# Patient Record
Sex: Female | Born: 1970 | Race: White | Hispanic: No | Marital: Married | Smoking: Never smoker
Health system: Southern US, Community
[De-identification: ages and names within clinical notes are randomized; demographics above are authoritative.]

## PROBLEM LIST (undated history)

## (undated) DIAGNOSIS — L719 Rosacea, unspecified: Secondary | ICD-10-CM

## (undated) DIAGNOSIS — Z8659 Personal history of other mental and behavioral disorders: Secondary | ICD-10-CM

## (undated) DIAGNOSIS — J302 Other seasonal allergic rhinitis: Secondary | ICD-10-CM

## (undated) DIAGNOSIS — Z87898 Personal history of other specified conditions: Secondary | ICD-10-CM

## (undated) DIAGNOSIS — R51 Headache: Secondary | ICD-10-CM

## (undated) DIAGNOSIS — D649 Anemia, unspecified: Secondary | ICD-10-CM

## (undated) DIAGNOSIS — R52 Pain, unspecified: Secondary | ICD-10-CM

## (undated) DIAGNOSIS — G709 Myoneural disorder, unspecified: Secondary | ICD-10-CM

## (undated) DIAGNOSIS — K819 Cholecystitis, unspecified: Secondary | ICD-10-CM

## (undated) DIAGNOSIS — Z889 Allergy status to unspecified drugs, medicaments and biological substances status: Secondary | ICD-10-CM

## (undated) HISTORY — DX: Other seasonal allergic rhinitis: J30.2

---

## 1997-08-10 ENCOUNTER — Other Ambulatory Visit: Admission: RE | Admit: 1997-08-10 | Discharge: 1997-08-10 | Payer: Self-pay | Admitting: Obstetrics and Gynecology

## 1998-09-04 ENCOUNTER — Other Ambulatory Visit: Admission: RE | Admit: 1998-09-04 | Discharge: 1998-09-04 | Payer: Self-pay | Admitting: Obstetrics and Gynecology

## 1999-08-27 ENCOUNTER — Other Ambulatory Visit: Admission: RE | Admit: 1999-08-27 | Discharge: 1999-08-27 | Payer: Self-pay | Admitting: *Deleted

## 2000-09-21 ENCOUNTER — Other Ambulatory Visit: Admission: RE | Admit: 2000-09-21 | Discharge: 2000-09-21 | Payer: Self-pay | Admitting: Obstetrics and Gynecology

## 2002-09-07 ENCOUNTER — Other Ambulatory Visit: Admission: RE | Admit: 2002-09-07 | Discharge: 2002-09-07 | Payer: Self-pay | Admitting: Obstetrics and Gynecology

## 2003-06-27 ENCOUNTER — Encounter: Admission: RE | Admit: 2003-06-27 | Discharge: 2003-06-27 | Payer: Self-pay | Admitting: Internal Medicine

## 2003-12-12 ENCOUNTER — Other Ambulatory Visit: Admission: RE | Admit: 2003-12-12 | Discharge: 2003-12-12 | Payer: Self-pay | Admitting: Obstetrics and Gynecology

## 2004-09-26 ENCOUNTER — Ambulatory Visit: Payer: Self-pay | Admitting: Internal Medicine

## 2005-01-13 ENCOUNTER — Ambulatory Visit: Payer: Self-pay | Admitting: Internal Medicine

## 2005-01-21 ENCOUNTER — Other Ambulatory Visit: Admission: RE | Admit: 2005-01-21 | Discharge: 2005-01-21 | Payer: Self-pay | Admitting: Obstetrics and Gynecology

## 2005-03-10 HISTORY — PX: MYOMECTOMY: SHX85

## 2005-03-11 ENCOUNTER — Ambulatory Visit: Payer: Self-pay | Admitting: Internal Medicine

## 2005-12-04 ENCOUNTER — Encounter (INDEPENDENT_AMBULATORY_CARE_PROVIDER_SITE_OTHER): Payer: Self-pay | Admitting: Specialist

## 2005-12-04 ENCOUNTER — Ambulatory Visit (HOSPITAL_COMMUNITY): Admission: RE | Admit: 2005-12-04 | Discharge: 2005-12-04 | Payer: Self-pay | Admitting: Obstetrics and Gynecology

## 2006-01-26 ENCOUNTER — Encounter (INDEPENDENT_AMBULATORY_CARE_PROVIDER_SITE_OTHER): Payer: Self-pay | Admitting: *Deleted

## 2006-01-26 ENCOUNTER — Ambulatory Visit (HOSPITAL_COMMUNITY): Admission: RE | Admit: 2006-01-26 | Discharge: 2006-01-28 | Payer: Self-pay | Admitting: Cardiology

## 2009-07-06 ENCOUNTER — Ambulatory Visit: Payer: Self-pay | Admitting: Family Medicine

## 2009-07-06 DIAGNOSIS — L659 Nonscarring hair loss, unspecified: Secondary | ICD-10-CM | POA: Insufficient documentation

## 2009-07-06 DIAGNOSIS — D649 Anemia, unspecified: Secondary | ICD-10-CM | POA: Insufficient documentation

## 2009-07-06 DIAGNOSIS — K219 Gastro-esophageal reflux disease without esophagitis: Secondary | ICD-10-CM | POA: Insufficient documentation

## 2009-07-06 DIAGNOSIS — R5383 Other fatigue: Secondary | ICD-10-CM

## 2009-07-06 DIAGNOSIS — R635 Abnormal weight gain: Secondary | ICD-10-CM | POA: Insufficient documentation

## 2009-07-06 DIAGNOSIS — R5381 Other malaise: Secondary | ICD-10-CM

## 2009-07-09 LAB — CONVERTED CEMR LAB
ALT: 16 units/L (ref 0–35)
BUN: 8 mg/dL (ref 6–23)
Basophils Absolute: 0 10*3/uL (ref 0.0–0.1)
Basophils Relative: 0.6 % (ref 0.0–3.0)
Bilirubin, Direct: 0.1 mg/dL (ref 0.0–0.3)
Calcium: 9.9 mg/dL (ref 8.4–10.5)
Cholesterol: 149 mg/dL (ref 0–200)
Creatinine, Ser: 0.7 mg/dL (ref 0.4–1.2)
Eosinophils Absolute: 0.1 10*3/uL (ref 0.0–0.7)
GFR calc non Af Amer: 99.25 mL/min (ref >60)
Glucose, Bld: 84 mg/dL (ref 70–99)
HCT: 41.5 % (ref 36.0–46.0)
HDL: 52 mg/dL (ref >39.00)
Lymphs Abs: 1.6 10*3/uL (ref 0.7–4.0)
MCHC: 33.9 g/dL (ref 30.0–36.0)
MCV: 89.5 fL (ref 78.0–100.0)
Neutro Abs: 4.1 10*3/uL (ref 1.4–7.7)
RDW: 13.1 % (ref 11.5–14.6)
Total Protein: 7 g/dL (ref 6.0–8.3)
VLDL: 26.6 mg/dL (ref 0.0–40.0)

## 2009-07-24 ENCOUNTER — Ambulatory Visit: Payer: Self-pay | Admitting: Pulmonary Disease

## 2009-07-24 DIAGNOSIS — R0609 Other forms of dyspnea: Secondary | ICD-10-CM

## 2009-07-24 DIAGNOSIS — R0989 Other specified symptoms and signs involving the circulatory and respiratory systems: Secondary | ICD-10-CM

## 2009-07-27 ENCOUNTER — Ambulatory Visit: Payer: Self-pay | Admitting: Pulmonary Disease

## 2010-04-11 NOTE — Procedures (Signed)
Summary: Apnea-Link  Apnea-Link   Imported By: Lester McKinley 08/10/2009 07:27:35  _____________________________________________________________________  External Attachment:    Type:   Image     Comment:   External Document

## 2010-04-11 NOTE — Assessment & Plan Note (Signed)
Summary: NEW PT/AETNA/NS/KDC   Vital Signs:  Patient profile:   40 year old female Height:      64 inches Weight:      165 pounds BMI:     28.42 Pulse rate:   80 / minute BP sitting:   120 / 90  Vitals Entered By: Kandice Hams (July 06, 2009 10:03 AM) CC: NEW PT CPX without pap/ former pt of Dr Cato Mulligan Comments Gyn Dr Marvel Plan   History of Present Illness: 40 yo woman here today to establish care.  Former pt of Dr Cato Mulligan.  GYN- Grewal.  Fatigue-  reports 'exhausted for months'.  wakes up not feeling rested.  husband reports pt is now snoring loudly.  pt and husband not aware if pt is having apnic episodes.  pt's father w/ CPAP.  Wt gain- has gained 10 lbs since Christmas.  has done [employer]- only lost 1/2 lb, [facility], is exercising regularly.  previously succeeded w/ [employer], isn't sure why she can't lose.  no family hx of thyroid issues.  Thinning hair- pt notices it most in the front of scalp.  can cover it w/ hair but is bothersome.  Preventive Screening-Counseling & Management  Alcohol-Tobacco     Alcohol drinks/day: <1     Alcohol type: wine rare every few months     Smoking Status: never  Caffeine-Diet-Exercise     Does Patient Exercise: yes      Sexual History:  currently monogamous.        Drug Use:  never.    Current Medications (verified): 1)  Yaz .Marland Kitchen.. 1 By Mouth Once Daily  Allergies (verified): No Known Drug Allergies  Past History:  Past Medical History: Anemia-NOS GERD rare hay fever/seasonal chicken pox  Past Surgical History: myomectomy Nov 2007 to remove fibroid in uterus  Family History: Family History of Arthritis Family History High cholesterol Family History Kidney disease.father prostate cancer/grandfather  Social History: Occupation: counselor Married 2 cats Never Smoked Occupation:  employed Does Patient Exercise:  yes Sexual History:  currently monogamous Drug Use:  never  Review of  Systems General:  Complains of fatigue and sleep disorder; denies chills, fever, loss of appetite, malaise, and weakness. Eyes:  Denies blurring and double vision. CV:  Denies chest pain or discomfort, difficulty breathing at night, difficulty breathing while lying down, fainting, lightheadness, palpitations, swelling of feet, and swelling of hands. MS:  Denies muscle aches, cramps, and muscle weakness. Derm:  Complains of hair loss; denies dryness. Psych:  Denies anxiety and depression.  Physical Exam  General:  Well-developed,well-nourished,in no acute distress; alert,appropriate and cooperative throughout examination Head:  NCAT, no apparent alopecia Eyes:  PERRL, EOMI Mouth:  very narrow palatine arch Neck:  thick, short neck Lungs:  Normal respiratory effort, chest expands symmetrically. Lungs are clear to auscultation, no crackles or wheezes. Heart:  Normal rate and regular rhythm. S1 and S2 normal without gallop, murmur, click, rub or other extra sounds. Pulses:  +2 carotid, radial, DP Extremities:  no C/C/E   Impression & Recommendations:  Problem # 1:  FATIGUE (ICD-780.79) Assessment New pt's fatigue possibly multifactorial- anemia, thyroid, sleep apnea.  given pt's hx of snoring and narrow palatine arch highly suspcious of sleep apnea.  will refer to pulm for sleep study.  check labs as below. Orders: Venipuncture (47829) T-Vitamin D (25-Hydroxy) [phone]) Pulmonary Referral (Pulmonary) TLB-CBC Platelet - w/Differential (85025-CBCD) TLB-TSH (Thyroid Stimulating Hormone) (84443-TSH) TLB-BMP (Basic Metabolic Panel-BMET) (80048-METABOL)  Problem # 2:  WEIGHT  GAIN (ICD-783.1) Assessment: New pt's wt gain is very bothersome to her.  reports previously she was able to lose wt following various structured plans.  currently unable to.  if pt has thyroid abnormalities or poor sleep pattern she is fighting an uphill battle.  will refer to pulm for sleep study.  encouraged pt  continue to exercise and follow diet to avoid excessive gain.  will follow.  Problem # 3:  HAIR LOSS (ICD-704.00) Assessment: New not overtly noticeable but pt concerned.  will refer to derm. Orders: Dermatology Referral (Derma)  Complete Medication List: 1)  Yaz  .Marland KitchenMarland Kitchen. 1 by mouth once daily  Other Orders: TLB-Lipid Panel (80061-LIPID) TLB-Hepatic/Liver Function Pnl (80076-HEPATIC)  Patient Instructions: 1)  Please follow up in 2 months to discuss how your appts went 2)  Someone will call you with your pulmonary referral to discuss sleep apnea 3)  We'll notify you of your lab results 4)  Keep up the efforts on diet and exercise 5)  Someone will call you with your dermatology appt 6)  Call with any questions or concerns 7)  Welcome!  We're glad to have you!

## 2010-04-11 NOTE — Assessment & Plan Note (Signed)
Summary: apnea link with no significant osa   Primary Provider/Referring Provider:  Beverely Low   History of Present Illness: the pt underwent apnea link testing with type 3 device for possible sleep disordered breathing Findings:  1) flow and oxygen saturation evaluation periods were greater than 11hrs. 2) She had no apneas, and only 8 hypopneas, giving her an AHI of 1/hr.  Significant snoring was noted. 3) low sat 83% transiently, with only one minute the entire night less than 88%.  Allergies: No Known Drug Allergies   Impression & Recommendations:  Problem # 1:  SNORING (ICD-786.09)  the pt has symptomatic snoring, but does not have osa.  I have discussed with her weight loss and positional therapy as conservative measures, and also dental appliance for more aggressive treatment.  she has a small oral cavity, but not really a lot of tissue redundancy that would be amenable to surgery.  The pt wishes to work on weight loss for now.  Orders: Sleep Std Airflow/Heartrate and O2 SAT unattended (11914)

## 2010-04-11 NOTE — Assessment & Plan Note (Signed)
Summary: consult for possible osa   Copy to:  Neena Rhymes Primary Provider/Referring Provider:  Neena Rhymes MD  CC:  Sleep Consult.  History of Present Illness: the pt is a 40y/o female who I have been asked to see for possible osa.  She has been noted to have loud snoring, but no one has commented on an abnormal breathing pattern or pauses.  She goes to bed at 9pm, and arises at 6am to start her day.  She does not feel rested upon arising.  She feels tired all of the time, and notes some sleep pressure after lunch at work with slow periods.  She thinks this does affect her concentration and work efficiency.  She denies dozing at night with tv, but does feel very tired and goes to bed early.  She denies any symptoms of RLS, and does not kick at night per her husband.  Her epworth score today is only 5, and she tells me that her weight is up about 15 pounds over the last 2 years.  Medications Prior to Update: 1)  Yaz .Marland Kitchen.. 1 By Mouth Once Daily  Allergies (verified): No Known Drug Allergies  Past History:  Past Medical History: Reviewed history from 07/06/2009 and no changes required. Anemia-NOS GERD rare hay fever/seasonal chicken pox  Past Surgical History: Reviewed history from 07/06/2009 and no changes required. myomectomy Nov 2007 to remove fibroid in uterus  Family History: Reviewed history from 07/06/2009 and no changes required. Family History of Arthritis Family History High cholesterol Family History Kidney disease.father (also had kidney transplant)  prostate cancer/grandfather  Social History: Reviewed history from 07/06/2009 and no changes required. Occupation: Theatre manager Married no children 2 cats Never Smoked  Review of Systems       The patient complains of weight change, sore throat, headaches, and sneezing.  The patient denies shortness of breath with activity, shortness of breath at rest, productive cough, non-productive cough,  coughing up blood, chest pain, irregular heartbeats, acid heartburn, indigestion, loss of appetite, abdominal pain, difficulty swallowing, tooth/dental problems, nasal congestion/difficulty breathing through nose, itching, ear ache, anxiety, depression, hand/feet swelling, joint stiffness or pain, rash, change in color of mucus, and fever.    Vital Signs:  Patient profile:   40 year old female Height:      64 inches Weight:      169 pounds BMI:     29.11 O2 Sat:      97 % on Room air Temp:     99.0 degrees F oral Pulse rate:   77 / minute BP sitting:   102 / 84  (left arm) Cuff size:   regular  Vitals Entered By: Arman Filter LPN (Jul 24, 2009 9:23 AM)  O2 Flow:  Room air CC: Sleep Consult Comments Medications reviewed with patient Arman Filter LPN  Jul 24, 2009 9:23 AM    Physical Exam  General:  ow female in nad Eyes:  PERRLA and EOMI.   Nose:  patent without discharge Mouth:  small posterior pharyngeal space, difficult to see due to large tongue and gag reflex. Neck:  no jvd, tmg , LN Lungs:  clear to auscultation Heart:  rrr, no mrg Abdomen:  soft and nontender, bs+ Extremities:  no edema noted, pulses intact distally no cyanosis Neurologic:  alert and oriented, moves all 4.   Impression & Recommendations:  Problem # 1:  SNORING (ICD-786.09) the pt has a history of snoring, and also nonrestorative sleep and daytime fatigue.  However,  her history is not overly impressive for true sleepiness, and her epworth score is only 5.  She is overweight, and has abnormal upper airway anatomy.  At this point, I think it is worthwhile to do a home screening study to put the issue to rest of snoring vs. sleep disordered breathing.  The pt is agreeable.  Other Orders: Consultation Level IV (60454) Misc. Referral (Misc. Ref)  Patient Instructions: 1)  will check home sleep study to evaluate for sleep apnea.  Will call with results when available.

## 2010-07-26 NOTE — Discharge Summary (Signed)
NAME:  Amanda Fox, Amanda Fox NO.:  [id number]   MEDICAL RECORD NO.:  [id number]          PATIENT TYPE:  INP   LOCATION:  9320                          FACILITY:  [facility]   PHYSICIAN:  Michelle L. Grewal, M.D.DATE OF BIRTH:  1970-08-02   DATE OF ADMISSION:  01/26/2006  DATE OF DISCHARGE:  01/28/2006                               DISCHARGE SUMMARY   ADMISSION DIAGNOSES:  1. Symptomatic fibroid.  2. Menorrhagia.  3. Anemia.   DISCHARGE DIAGNOSES:  1. Symptomatic fibroid.  2. Menorrhagia.  3. Anemia.  4. Status post abdominal myomectomy.   HOSPITAL COURSE:  This patient is a 40 year old, gravida 0, with  symptomatic fibroids and menorrhagia.  She has been anemic.  On the day  of surgery, she underwent an abdominal myomectomy.  EBL was 400 mL.  She  did very well postop.  On postop day number 1, her hemoglobin was 9.9.  By postop day one, she was tolerating regular food, ambulating, voiding,  and had good pain control.  The patient was discharged home on postop  day number 2.  She remained afebrile, with stable vital signs throughout  her hospitalization.  She was given ibuprofen 600 mg q.6 to take as  needed for pain, as well as Tylox 1-2 every 4-6 to take as needed for  pain.  She will followup on Monday to remove her staples.  She was  advised no driving for 1-2 weeks.  No intercourse for 6 weeks.  To call  if she has a temperature greater than 100.5, nausea, vomiting, severe  abdominal pain, redness or drainage from incision site.      Michelle L. Vincente Poli, M.D.  Electronically Signed     MLG/MEDQ  D:  02/16/2006  T:  02/16/2006  Job:  [id number]

## 2010-07-26 NOTE — Op Note (Signed)
NAME:  Amanda Fox, SICARD NO.:  [id number]   MEDICAL RECORD NO.:  [id number]          PATIENT TYPE:  AMB   LOCATION:  SDC                           FACILITY:  [facility]   PHYSICIAN:  Michelle L. Grewal, M.D.DATE OF BIRTH:  11/10/1970   DATE OF PROCEDURE:  12/04/2005  DATE OF DISCHARGE:                                 OPERATIVE REPORT   PREOPERATIVE DIAGNOSES:  1. Intracavitary fibroid.  2. Menorrhagia.  3. Anemia.   POSTOPERATIVE DIAGNOSES:  1. Intracavitary fibroid.  2. Menorrhagia.  3. Anemia.   PROCEDURE:  D&C hysteroscopy and partial resection of intracavitary fibroid.   SURGEON:  Dr. Vincente Poli   ANESTHESIA:  General.   SPECIMENS:  Fibroid fragment.   ESTIMATED BLOOD LOSS:  100 mL.   COMPLICATIONS:  None.   PROCEDURE:  The patient was taken to the operating room.  She was then  intubated without difficulty.  She was prepped and draped in the usual  sterile fashion.  In and out catheter was used to empty the bladder.  A  speculum was inserted; the cervix was grasped, and a paracervical block is  performed in the at 5 to 7 o'clock position in the standard fashion.  The  cervical internal os was gently dilated to 31 [place] Pratt dilator.  The  operative hysteroscope was inserted into the uterine cavity.  The uterine  cavity appeared very atrophic and thin, but the patient had received some  Depo Lupron.  However, there was a very large intracavitary fibroid that  appeared to emanate from the posterior wall of the uterus in the lower  uterine segment and basically touched from anterior to posterior and went  from wall to wall laterally.  Using the resectoscope and the loop, carefully  I started at the base and began resecting with careful attention just to  resect, as I was moving from distal to proximal.  There was careful  attention to look at the fluid deficits while we were doing this with our  sorbitol solution.  I initially started at the base, trying to  disrupt as  much vascular flow to the fibroid as possible by starting at the base.  The  actual fibroid during the resection became much more mobile and almost  seemed to twist around more on its stalk.  However, the fluid deficit began  to reach close to 500 mL and at that point, I felt it prudent to discontinue  resection, and the patient was given 10 mg of Lasix at my direction.  At  this point, the fibroid was probably 50% removed, and the base of the  fibroid from which the blood supply was coming from was at least 70-90%  disrupted.  I did use a curette to remove as many fragments as possible from  the uterine cavity and did do a curettage at the same time.  The fibroid was  not completely removed, but I stopped the procedure because IV fluid  deficit, and I was afraid that I would exceed that, and the patient would  have complication from excessive fluid overload.  At this point, there was  minimal bleeding noted after all instruments were removed from the vagina,  and the patient was given Toradol.  She was then taken to recovery after she  was extubated in stable condition.  All sponge, lap, and instrument counts  were correct x2.  I think that the patient will need a postoperative  ultrasound at some point, possibly Depo Lupron treatment which I will  discuss with her.  If, in the future, she will need another resection or  removal of the fibroid, she may need to have this done as an abdominal case,  it would depend on how much of the blood flow was disrupted at the time of  the procedure today, and hopefully that will help with the shrinkage of the  fibroid.  I will discuss all of this with her and her mother.      Michelle L. Vincente Poli, M.D.  Electronically Signed     MLG/MEDQ  D:  12/04/2005  T:  12/06/2005  Job:  [id number]

## 2010-07-26 NOTE — Op Note (Signed)
NAME:  Amanda Fox, Amanda Fox NO.:  [id number]   MEDICAL RECORD NO.:  [id number]          PATIENT TYPE:  INP   LOCATION:  9399                          FACILITY:  [facility]   PHYSICIAN:  Michelle L. Grewal, M.D.DATE OF BIRTH:  07/26/1970   DATE OF PROCEDURE:  01/26/2006  DATE OF DISCHARGE:                               OPERATIVE REPORT   PREOPERATIVE DIAGNOSES:  1. Symptomatic fibroid.  2. Menorrhagia.   POSTOPERATIVE DIAGNOSES:  1. Symptomatic fibroid.  2. Menorrhagia.   PROCEDURE:  Abdominal myomectomy.   SURGEON:  Michelle L. Vincente Poli, M.D.   ASSISTANT:  Zelphia Cairo, M.D.   ANESTHESIA:  General.   SPECIMENS:  Fibroids.   ESTIMATED BLOOD LOSS:  Less than 100 cc.   COMPLICATIONS:  None.   PROCEDURE:  The patient was taken to the operating room.  She was  intubated without difficulty.  She was prepped and draped in the usual  sterile fashion.  A Foley catheter was inserted and draining clear  urine.  A low transverse incision was made and it was carried down to  the fascia.  The fascia was scored in the midline and extended  laterally.  Rectus muscles were separated in the midline.  The  peritoneum was entered bluntly.  Peritoneal incision was stretched.  We  then did a thorough exploration of the pelvis.  Uterus was lifted up  through the incision.  It was slightly enlarged.  There were no  adhesions and no evidence of endometriosis.  The ovaries appeared  normal.  I could palpate the intracavitary fibroid.  It was  approximately 3.5 cm in width.  It seemed to be arising from the  posterior wall of the uterus.  There was also one smaller subserosal  fibroid off of the right side of the fundus and three very small  subserosal fibroids.  I initially made the first incision in the  anterior portion of the uterus.  Went down easily.  Lifted the fibroid  up with a towel clip and removed from its base.  Bleeding was very  minimal.  We then closed that incision  using two layers using 0 Vicryl  suture.  It was hemostatic.  At this point I then removed the smaller  subserosal using the Bovie and the larger one near the right side of the  fundus was removed using the Bovie and then was hemostatic with the  figure-of-eight a Vicryl suture.  Hemostasis was excellent.  All  incisions were away from the fallopian tube.  She will of course need a  C-section in the future and this patient had been counseled of this  prior to surgery.  All fibroids were sent to pathology.  INTERCEED was  placed over the uterine incision after irrigation was performed.  The  uterus was gently placed back in the abdominal cavity.  The peritoneum  was closed  using 0 Vicryl and the fascia was then closed using 0 Vicryl continuous  running stitch starting each corner meeting in the midline.  After  irrigation of subcutaneous layer the skin was closed with staples.  All  sponge, lap and instrument counts were correct x2.  The patient went to  recovery room in stable condition.      Michelle L. Vincente Poli, M.D.  Electronically Signed     MLG/MEDQ  D:  01/26/2006  T:  01/26/2006  Job:  [id number]

## 2011-06-14 LAB — HM PAP SMEAR: HM PAP: NORMAL

## 2012-02-27 ENCOUNTER — Other Ambulatory Visit: Payer: Self-pay | Admitting: Obstetrics and Gynecology

## 2012-04-01 ENCOUNTER — Ambulatory Visit: Payer: Self-pay | Admitting: Family Medicine

## 2012-07-02 ENCOUNTER — Ambulatory Visit (INDEPENDENT_AMBULATORY_CARE_PROVIDER_SITE_OTHER): Payer: BC Managed Care – PPO | Admitting: Internal Medicine

## 2012-07-02 ENCOUNTER — Encounter: Payer: Self-pay | Admitting: Internal Medicine

## 2012-07-02 VITALS — BP 124/84 | HR 77 | Temp 99.2°F | Wt 173.0 lb

## 2012-07-02 DIAGNOSIS — IMO0002 Reserved for concepts with insufficient information to code with codable children: Secondary | ICD-10-CM

## 2012-07-02 DIAGNOSIS — S8391XA Sprain of unspecified site of right knee, initial encounter: Secondary | ICD-10-CM

## 2012-07-02 NOTE — Patient Instructions (Addendum)
Motrin 200 mg 2 tablets every 6 hours as needed for pain. Always take it with food. Watch for stomach side effects (gastritis): nausea, stomach pain, change in the color of stools. Call if pain,swelling in the calf develops

## 2012-07-02 NOTE — Progress Notes (Signed)
  Subjective:    Patient ID: Amanda Fox, female    DOB: 1970-06-05, 42 y.o.   MRN: [id number]  HPI Acute visit 10 days ago, at work, apparently she stepped on an uneven carpet , had a fall, landed on her hands, in the process twisted her ankles and right leg;Immediately after the incident she developed bilateral ankle pain and right calf pain. Symptoms subsided 3 days later. This week for 2 days had  numbness on the right toes (except the great toe). Symptoms are now subsided.  Past Medical History: Anemia  GERD rare hay fever/seasonal chicken pox  Past Surgical History: myomectomy Nov 2007 to remove fibroid in uterus  Social History: Occupation: counselor Married ETOH-rarely Never Smoked    Review of Systems Denies any other injuries like in the neck or head. No rash at the legs or back . From time to time she has pain in the tailbone since 2000 when she had an accident,+  Tailbone Pain Last Week. No Actual Calf Swelling That She Can Feel     Objective:   Physical Exam  General -- alert, well-developed,No apparent distress, vital signs are stable.   Extremities-- no pretibial edema bilaterally, calves are measured and  symmetric. Not tender to palpation.Good pedal pulses bilaterally, feet and ankle normal to inspection on palpation. Neurologic-- alert & oriented X3 ; Gait normal, DTRs and motor exam symmetric. Pinprick examination of the lower extremities normal. Psych-- Cognition and judgment appear intact. Alert and cooperative with normal attention span and concentration.  not anxious appearing and not depressed appearing.        Assessment & Plan:  Right leg sprain, Apparently the patient sprained her right leg 10 days ago when she fell; motor,vascular and neurological exam are intact. No evidence of DVT. Unclear why her toes were numb this week, symptoms resolved. Recommend observation and Motrin as needed.Will call if pain resurface or if swelling develops.

## 2012-07-23 ENCOUNTER — Ambulatory Visit (INDEPENDENT_AMBULATORY_CARE_PROVIDER_SITE_OTHER): Payer: BC Managed Care – PPO | Admitting: Family Medicine

## 2012-07-23 ENCOUNTER — Encounter: Payer: Self-pay | Admitting: Family Medicine

## 2012-07-23 VITALS — BP 116/88 | HR 97 | Temp 98.2°F | Ht 63.5 in | Wt 174.0 lb

## 2012-07-23 DIAGNOSIS — IMO0002 Reserved for concepts with insufficient information to code with codable children: Secondary | ICD-10-CM

## 2012-07-23 DIAGNOSIS — R259 Unspecified abnormal involuntary movements: Secondary | ICD-10-CM

## 2012-07-23 DIAGNOSIS — M62838 Other muscle spasm: Secondary | ICD-10-CM | POA: Insufficient documentation

## 2012-07-23 DIAGNOSIS — R258 Other abnormal involuntary movements: Secondary | ICD-10-CM

## 2012-07-23 DIAGNOSIS — M792 Neuralgia and neuritis, unspecified: Secondary | ICD-10-CM | POA: Insufficient documentation

## 2012-07-23 MED ORDER — NAPROXEN 500 MG PO TABS: 500.0000 mg | ORAL_TABLET | Freq: Two times a day (BID) | ORAL | Status: DC

## 2012-07-23 MED ORDER — CYCLOBENZAPRINE HCL 5 MG PO TABS: 5.0000 mg | ORAL_TABLET | Freq: Three times a day (TID) | ORAL | Status: DC | PRN

## 2012-07-23 NOTE — Progress Notes (Signed)
  Subjective:    Patient ID: Amanda Fox, female    DOB: 15-Jul-1970, 42 y.o.   MRN: [id number]  HPI Nerve pain- pain will start in back of neck and radiate into R shoulder and down R arm.  In the last week-10 days pain has started radiating up into head.  Also having burning feeling in R leg starting at head of fibula and radiating down into R foot.  Has been taking ibuprofen and using heating pad w/ intermittent relief.  Also having low back pain on both R and L w/ numbness of legs intermittently.  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  Previously using CPAP but stopped using this due to deep creases in her face in the AM that were still present when she went to work.   Review of Systems For ROS see HPI     Objective:   Physical Exam  Vitals reviewed. Constitutional: She is oriented to person, place, and time. She appears well-developed and well-nourished. No distress.  Neck: Normal range of motion.  R trap spasm  Musculoskeletal: Normal range of motion. She exhibits no edema and no tenderness.  (-) Spurling's sign Full ROM of neck, shoulders bilaterally  Neurological: She is alert and oriented to person, place, and time. She has normal reflexes. No cranial nerve deficit. Coordination normal.  Skin: Skin is warm and dry.  Psychiatric: She has a normal mood and affect. Her behavior is normal.          Assessment & Plan:

## 2012-07-23 NOTE — Patient Instructions (Addendum)
Start the Naproxen twice daily- take w/ food Use the flexeril at night and on weekends- will cause drowsiness We'll call you with your neuro appt for complete evaluation Call with any questions or concerns Hang in there!

## 2012-07-25 NOTE — Assessment & Plan Note (Signed)
New to provider.  Pt's sxs are diffuse and randomly occuring.  Must r/o MS- could also consider fibromyalgia.  Due to extensive distribution of sxs will refer to neuro for complete evaluation and tx.  Pt expressed understanding and is in agreement w/ plan.

## 2012-07-25 NOTE — Assessment & Plan Note (Signed)
New.  Discovered on sleep study.  Improved w/ CPAP but pt has stopped using this.  Previously was not interested in starting benzos or anticonvulsants.  Now considering it due to poor sleep.  Pt to discuss this w/ neuro.

## 2012-07-25 NOTE — Assessment & Plan Note (Signed)
New.  Start scheduled NSAIDs, muscle relaxers PRN.  Heating pad.  Will follow.

## 2012-07-26 ENCOUNTER — Telehealth: Payer: Self-pay | Admitting: General Practice

## 2012-07-26 MED ORDER — METHOCARBAMOL 750 MG PO TABS: 750.0000 mg | ORAL_TABLET | Freq: Three times a day (TID) | ORAL | Status: DC | PRN

## 2012-07-26 NOTE — Telephone Encounter (Signed)
meds switched and chart updated.

## 2012-07-26 NOTE — Telephone Encounter (Signed)
Pt called today to advise that she had been given Flexeril at her appt on Friday. Pt took one pill on Friday night and spent all day Saturday sleeping. Pt decided to take 1/2 pill on Saturday night and had the same side effects. Is there something other than this medication she could take?   Pt also asked about Neuro referral. This has been placed and Dr. Arbutus Leas is reviewing to see if pt is acceptable pt for office. They will call to schedule with pt once this review is completed.

## 2012-07-26 NOTE — Telephone Encounter (Signed)
Ok to switch to Robaxin 750mg  TID prn for muscle spasm- this should be less sedating

## 2012-08-18 ENCOUNTER — Ambulatory Visit (INDEPENDENT_AMBULATORY_CARE_PROVIDER_SITE_OTHER): Payer: BC Managed Care – PPO | Admitting: Neurology

## 2012-08-18 ENCOUNTER — Encounter: Payer: Self-pay | Admitting: Neurology

## 2012-08-18 VITALS — BP 122/80 | HR 74 | Temp 98.7°F | Resp 12 | Ht 64.0 in | Wt 175.0 lb

## 2012-08-18 DIAGNOSIS — R209 Unspecified disturbances of skin sensation: Secondary | ICD-10-CM

## 2012-08-18 MED ORDER — TOPIRAMATE 25 MG PO TABS: 25.0000 mg | ORAL_TABLET | ORAL | Status: DC

## 2012-08-18 NOTE — Patient Instructions (Addendum)
Epsom Salt baths.   Take the Topamax.  Follow up in two weeks.

## 2012-08-18 NOTE — Progress Notes (Signed)
Amanda Fox is a 42 year old woman with upper airway resistance syndrome and a history of a fall in the year 2000 which has led to recurring left buttock and left leg issues.  She was carrying a heavy book PAC when her legs went out from under her and she fell on her left sit bone.  She had pain and bruising in that area and ever since then she has trouble with digging in sitting a long time in certain postures. This can cause numbness or tingling that goes down the left leg to the top of the left foot.  She can also get some tightness in the left shoulder and occasional tingling down the left arm. This does seem to cycle and she can take Advil which will help left buttock pain to a certain degree and she avoids certain activities.  For 2 months, she's now had pain in the right occipital area as well as tingling that will go down the outside of the right calf to the top of the right foot.  It does not prevent her from sleeping at night. She sleeps through the night.  She tried CPAP for about a year for upper airway resistant syndrome with an RDI of 25 an AHI of 2.5.  There was slight benefit and an amount of fatigue she has upon awakening.  Currently she gets close to 8 hours of sleep and on Sunday she may take an hour and a half now which is refreshing. She also has occasional paresthesias down the right arm into the third and fourth fingers of the right hand.  It doesn't prevent her from doing anything or holding anything or doing her normal activities.  The sleep study also suggested some limb movements but not clear she has restless legs versus limb movements associated with the breathing issues.  She tried an Internet order oral appliance which she didn't particularly care for her. She tends to sleep more on her side.  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 She has not been exercising real aggressively and she is  interested in weight loss.  Review of systems is positive for seasonal allergies, history of GERD, perfectionistic tendencies and some anxiety not requiring medications.  Remainder of review of systems is negative.  Past Medical History  Diagnosis Date  . Seasonal allergies     Current Outpatient Prescriptions on File Prior to Visit  Medication Sig Dispense Refill  . fexofenadine (ALLEGRA) 180 MG tablet Take 180 mg by mouth daily.      . naproxen (NAPROSYN) 500 MG tablet Take 1 tablet (500 mg total) by mouth 2 (two) times daily with a meal.  60 tablet  0  . methocarbamol (ROBAXIN) 750 MG tablet Take 1 tablet (750 mg total) by mouth 3 (three) times daily as needed.  30 tablet  0   No current facility-administered medications on file prior to visit.   Review of patient's allergies indicates no known allergies.  History   Social History  . Marital Status: Married    Spouse Name: N/A    Number of Children: N/A  . Years of Education: N/A   Occupational History  . Not on file.   Social History Main Topics  . Smoking status: Never Smoker   . Smokeless tobacco: Never Used  . Alcohol Use: Yes     Comment: rare  . Drug Use: No  .  Sexually Active: Not on file   Other Topics Concern  . Not on file   Social History Narrative  . No narrative on file   No family history on file.   BP 122/80  Pulse 74  Temp(Src) 98.7 F (37.1 C)  Resp 12  Ht 5\' 4"  (1.626 m)  Wt 175 lb (79.379 kg)  BMI 30.02 kg/m2  LMP 07/08/2012   Alert and oriented x 3.  Memory function appears to be intact.  Concentration and attention are normal for educational level and background.  Speech is fluent and without significant word finding difficulty.  Is aware of current events.  No carotid bruits detected.  Cranial nerve II through XII are within normal limits.  This includes normal optic discs and acuity, EOMI, PERLA, facial movement and sensation intact, hearing grossly intact, gag intact,Uvula raises  symmetrically and tongue protrudes evenly. Motor strength is 5 over 5 throughout all limbs.  No atrophy, abnormal tone or tremors. Reflexes are 2+ and symmetric in the upper and lower extremities Sensory exam is intact. Coordination is intact for fine movements and rapid alternating movements in all limbs Gait and station are normal.   Impression: 1. Injury 15 years ago with recurring episodes of pain in the left buttock and paresthesias running down the left leg, which may be due to piriformis syndrome or related disorder. 2. Cervical tension and misalignment which results in occasional paresthesias in the upper extremities. 3. More recently she is having paresthesias down the right lower extremity most likely due to lumbar misalignment or piriformis syndrome on the right. 4. Upper airway resistance syndrome having already tried CPAP and an Internet order oral appliance.  Plan: For the fatigue she can try increasing to 8-1/2 hours of sleep average and continuing to sleep on her side.  Weight loss would also be helpful for the upper airway resistance syndrome. She will contact [facility] for acupuncture for the lumbar and cervical misalignment with paresthesias. She can try Topamax 25 mg at night for the paresthesias which may also help with weight loss. Epsom salt baths once a week. Return in 2 weeks for followup.

## 2012-09-01 ENCOUNTER — Encounter: Payer: Self-pay | Admitting: Neurology

## 2012-09-01 ENCOUNTER — Ambulatory Visit (INDEPENDENT_AMBULATORY_CARE_PROVIDER_SITE_OTHER): Payer: BC Managed Care – PPO | Admitting: Neurology

## 2012-09-01 VITALS — BP 118/74 | HR 90 | Temp 98.8°F | Ht 64.0 in | Wt 174.0 lb

## 2012-09-01 DIAGNOSIS — G4733 Obstructive sleep apnea (adult) (pediatric): Secondary | ICD-10-CM

## 2012-09-01 DIAGNOSIS — R209 Unspecified disturbances of skin sensation: Secondary | ICD-10-CM

## 2012-09-01 DIAGNOSIS — M549 Dorsalgia, unspecified: Secondary | ICD-10-CM

## 2012-09-01 MED ORDER — TOPIRAMATE 25 MG PO TABS: ORAL_TABLET | ORAL | Status: DC

## 2012-09-01 NOTE — Patient Instructions (Addendum)
Follow up if needed here in our office.  Increase the Topamax as directed.

## 2012-09-01 NOTE — Progress Notes (Signed)
Amanda Fox is a 42 year old woman who follows up for upper airway resistance syndrome, sleep difficulty, low back pain, intermittent left-sided piriformis syndrome and paresthesias in the right leg and right arm.  She is now on Topamax 25 mg at night.  She states that she is sleeping 50% better.  She still wakes up feeling somewhat tired but she is left exhausted feeling during the daytime.  She had not tolerated CPAP in the past.  She also did not tolerate an oral appliance all that well in the past, although it was not a custom model.  She has had acupuncture once and her back pain is 80% better.  The symptom that has changed the least is the paresthesia of the right leg and right arm.  She thinks she may have lost 2 pounds, but nothing more than that and she would like to increase the Topamax. She did experience some change in taste for carbonated beverage.  Remainder of the review of systems is unremarkable.  Past Medical History  Diagnosis Date  . Seasonal allergies     Current Outpatient Prescriptions on File Prior to Visit  Medication Sig Dispense Refill  . fexofenadine (ALLEGRA) 180 MG tablet Take 180 mg by mouth daily.      . naproxen (NAPROSYN) 500 MG tablet Take 1 tablet (500 mg total) by mouth 2 (two) times daily with a meal.  60 tablet  0  . topiramate (TOPAMAX) 25 MG tablet Take 1 tablet (25 mg total) by mouth 1 day or 1 dose.  60 tablet  11  . methocarbamol (ROBAXIN) 750 MG tablet Take 1 tablet (750 mg total) by mouth 3 (three) times daily as needed.  30 tablet  0   No current facility-administered medications on file prior to visit.   Review of patient's allergies indicates no known allergies.  History   Social History  . Marital Status: Married    Spouse Name: N/A    Number of Children: N/A  . Years of Education: N/A   Occupational History  . Not on file.   Social History Main Topics  . Smoking status: Never Smoker   . Smokeless tobacco: Never Used  . Alcohol Use: Yes      Comment: rare  . Drug Use: No  . Sexually Active: Not on file   Other Topics Concern  . Not on file   Social History Narrative  . No narrative on file   No family history on file. BP 118/74  Pulse 90  Temp(Src) 98.8 F (37.1 C)  Ht 5\' 4"  (1.626 m)  Wt 174 lb (78.926 kg)  BMI 29.85 kg/m2   Alert and oriented x 3.  Memory function appears to be intact.  Concentration and attention are normal for educational level and background.  Speech is fluent and without significant word finding difficulty.  Is aware of current events.  No carotid bruits detected.  Cranial nerve II through XII are within normal limits.  This includes normal optic discs and acuity, EOMI, PERLA, facial movement and sensation intact, hearing grossly intact, gag intact,Uvula raises symmetrically and tongue protrudes evenly. Motor strength is 5 over 5 throughout all limbs.  No atrophy, abnormal tone or tremors. Reflexes are 3+ and symmetric in the upper and lower extremities Sensory exam is intact. Coordination is intact for fine movements and rapid alternating movements in all limbs Gait and station are normal.   Impression: 1. Upper airway resistance syndrome.  She is x-ray sleeping better at this point is  still feeling some fatigue in the morning.  She would like to try to manage this with weight loss. 2. Back pain from old injuries.  This is improved with her first acupuncture visit and she plans to continue at least 2 more visits. 3. Possible piriformis syndrome. 4. Paresthesias of the right arm and right leg for the past 3 months not particularly getting any worse.  Plan: 1. We'll increase Topamax to 50 mg q.h.s. Which the patient would like to try. 2. Continue her acupuncture. 3. Gluten-free and somewhat reduced her diet to assist with her weight loss program which should be greatly beneficial for the upper airway resistant syndrome. 4. Return in neurology p.r.n..

## 2013-01-08 LAB — HM MAMMOGRAPHY: HM MAMMO: NORMAL

## 2013-01-13 ENCOUNTER — Other Ambulatory Visit: Payer: Self-pay

## 2013-01-14 ENCOUNTER — Encounter: Payer: Self-pay | Admitting: Family Medicine

## 2013-01-14 ENCOUNTER — Ambulatory Visit (INDEPENDENT_AMBULATORY_CARE_PROVIDER_SITE_OTHER): Payer: BC Managed Care – PPO | Admitting: Family Medicine

## 2013-01-14 VITALS — BP 120/78 | HR 98 | Temp 98.0°F | Resp 16 | Wt 157.2 lb

## 2013-01-14 DIAGNOSIS — J069 Acute upper respiratory infection, unspecified: Secondary | ICD-10-CM | POA: Insufficient documentation

## 2013-01-14 MED ORDER — PROMETHAZINE-DM 6.25-15 MG/5ML PO SYRP: 5.0000 mL | ORAL_SOLUTION | Freq: Four times a day (QID) | ORAL | Status: DC | PRN

## 2013-01-14 MED ORDER — BENZONATATE 200 MG PO CAPS: 200.0000 mg | ORAL_CAPSULE | Freq: Three times a day (TID) | ORAL | Status: DC | PRN

## 2013-01-14 NOTE — Patient Instructions (Signed)
Follow up as needed Start the cough syrup at night- will cause drowsiness Take the cough pills for daytime cough Drink plenty of fluids REST! Hang in there!

## 2013-01-14 NOTE — Progress Notes (Signed)
  Subjective:    Patient ID: Amanda Fox, female    DOB: [birth year], 42 y.o.   MRN: [id number]  HPI Cough- sxs started 6 days ago.  + nasal congestion.  Was unable to sleep last night due to cough.  Cough is dry and barking.  No fevers.  No facial pain/pressure.  No ear pain.  + sick contacts.   Review of Systems For ROS see HPI     Objective:   Physical Exam  Vitals reviewed. Constitutional: She appears well-developed and well-nourished. No distress.  HENT:  Head: Normocephalic and atraumatic.  TMs normal bilaterally Mild nasal congestion Throat w/out erythema, edema, or exudate  Eyes: Conjunctivae and EOM are normal. Pupils are equal, round, and reactive to light.  Neck: Normal range of motion. Neck supple.  Cardiovascular: Normal rate, regular rhythm, normal heart sounds and intact distal pulses.   No murmur heard. Pulmonary/Chest: Effort normal and breath sounds normal. No respiratory distress. She has no wheezes.  + hacking cough  Lymphadenopathy:    She has no cervical adenopathy.          Assessment & Plan:

## 2013-01-14 NOTE — Assessment & Plan Note (Signed)
New.  No evidence of bacterial infxn.  No need for abx.  Cough meds prn.  Reviewed supportive care and red flags that should prompt return.  Pt expressed understanding and is in agreement w/ plan.  

## 2013-06-09 ENCOUNTER — Telehealth: Payer: Self-pay

## 2013-06-09 NOTE — Telephone Encounter (Signed)
Left message for call back Non-identifiable   Pap- 02/27/12-negative [facility]- Flu- Td- 03/11/95-DUE

## 2013-06-13 ENCOUNTER — Ambulatory Visit (INDEPENDENT_AMBULATORY_CARE_PROVIDER_SITE_OTHER): Payer: 59 | Admitting: Family Medicine

## 2013-06-13 ENCOUNTER — Encounter: Payer: Self-pay | Admitting: Family Medicine

## 2013-06-13 VITALS — BP 126/82 | HR 121 | Temp 99.1°F | Resp 16 | Ht 64.75 in | Wt 158.2 lb

## 2013-06-13 DIAGNOSIS — F988 Other specified behavioral and emotional disorders with onset usually occurring in childhood and adolescence: Secondary | ICD-10-CM

## 2013-06-13 DIAGNOSIS — R768 Other specified abnormal immunological findings in serum: Secondary | ICD-10-CM | POA: Insufficient documentation

## 2013-06-13 DIAGNOSIS — Z0000 Encounter for general adult medical examination without abnormal findings: Secondary | ICD-10-CM | POA: Insufficient documentation

## 2013-06-13 DIAGNOSIS — L719 Rosacea, unspecified: Secondary | ICD-10-CM

## 2013-06-13 DIAGNOSIS — F900 Attention-deficit hyperactivity disorder, predominantly inattentive type: Secondary | ICD-10-CM

## 2013-06-13 DIAGNOSIS — R000 Tachycardia, unspecified: Secondary | ICD-10-CM | POA: Insufficient documentation

## 2013-06-13 DIAGNOSIS — R894 Abnormal immunological findings in specimens from other organs, systems and tissues: Secondary | ICD-10-CM

## 2013-06-13 MED ORDER — ATOMOXETINE HCL 80 MG PO CAPS: 80.0000 mg | ORAL_CAPSULE | Freq: Every day | ORAL | Status: DC

## 2013-06-13 NOTE — Assessment & Plan Note (Signed)
Pt's PE WNL.  UTD on GYN.  Check labs.  Anticipatory guidance provided.  

## 2013-06-13 NOTE — Assessment & Plan Note (Signed)
New to provider.  Pt has hx of this during her neuro w/u for paresthesias.  Would like this repeated.  If +, will need referral to Rheum.  Pt expressed understanding and is in agreement w/ plan.

## 2013-06-13 NOTE — Assessment & Plan Note (Signed)
New.  Suspect this is due to pt's stimulant use.  Stop adderall.  If still having palpitations or symptoms will need cardiology w/u.  Pt expressed understanding and is in agreement w/ plan.

## 2013-06-13 NOTE — Telephone Encounter (Signed)
Unable to reach pre visit.  

## 2013-06-13 NOTE — Progress Notes (Signed)
Pre visit review using our clinic review tool, if applicable. No additional management support is needed unless otherwise documented below in the visit note. 

## 2013-06-13 NOTE — Progress Notes (Signed)
   Subjective:    Patient ID: Amanda Fox, female    DOB: [birth year], 43 y.o.   MRN: [id number]  HPI CPE- UTD on GYN ([facility]).  Now needs Derm referral due to insurance change.  Tachycardia- started Adderall in July.  Will check HR and BP on days she does not take stimulant and both are fine.  Taking short acting formulation 10mg  in the AM b/c taking it twice daily was causing insomnia.  Now having palpitations.  ADD- inattentive type, has seen Dr Toy Care x2.  Hx of + ANA, would like this to be repeated   Review of Systems Patient reports no vision/ hearing changes, adenopathy,fever, weight change,  persistant/recurrent hoarseness , swallowing issues, chest pain, edema, persistant/recurrent cough, hemoptysis, dyspnea (rest/exertional/paroxysmal nocturnal), gastrointestinal bleeding (melena, rectal bleeding), abdominal pain, significant heartburn, bowel changes, GU symptoms (dysuria, hematuria, incontinence), Gyn symptoms (abnormal  bleeding, pain),  syncope, memory loss, skin/hair/nail changes, abnormal bruising or bleeding, anxiety, or depression.  + palpitations w/ stimulant + paresthesias- has been seeing Neuro     Objective:   Physical Exam General Appearance:    Alert, cooperative, no distress, appears stated age  Head:    Normocephalic, without obvious abnormality, atraumatic  Eyes:    PERRL, conjunctiva/corneas clear, EOM's intact, fundi    benign, both eyes  Ears:    Normal TM's and external ear canals, both ears  Nose:   Nares normal, septum midline, mucosa normal, no drainage    or sinus tenderness  Throat:   Lips, mucosa, and tongue normal; teeth and gums normal  Neck:   Supple, symmetrical, trachea midline, no adenopathy;    Thyroid: no enlargement/tenderness/nodules  Back:     Symmetric, no curvature, ROM normal, no CVA tenderness  Lungs:     Clear to auscultation bilaterally, respirations unlabored  Chest Wall:    No tenderness or deformity   Heart:    Tachy but  regular S1 and S2 normal, no murmur, rub   or gallop  Breast Exam:    Deferred to GYN  Abdomen:     Soft, non-tender, bowel sounds active all four quadrants,    no masses, no organomegaly  Genitalia:    Deferred to GYN  Rectal:    Extremities:   Extremities normal, atraumatic, no cyanosis or edema  Pulses:   2+ and symmetric all extremities  Skin:   Skin color, texture, turgor normal, no rashes or lesions  Lymph nodes:   Cervical, supraclavicular, and axillary nodes normal  Neurologic:   CNII-XII intact, normal strength, sensation and reflexes    throughout          Assessment & Plan:

## 2013-06-13 NOTE — Patient Instructions (Signed)
Follow up in 4-6 weeks to recheck pulse and attention Start the Strattera as directed We'll call you with your Derm referral We'll notify you of your lab results and make any changes if needed If the ANA is again positive, we'll refer you to Rheumatology Call with any questions or concerns Hang in there!  We'll figure this out!

## 2013-06-13 NOTE — Assessment & Plan Note (Signed)
New to provider.  Ongoing for pt.  Suspect her Adderall use is causing her tachycardia as her HR is normal on days she does not take med.  Stop Adderall, start Strattera.  Will monitor for side effects and improvement.

## 2013-06-14 LAB — BASIC METABOLIC PANEL
BUN: 10 mg/dL (ref 6–23)
CHLORIDE: 106 meq/L (ref 96–112)
CO2: 23 meq/L (ref 19–32)
Calcium: 10.6 mg/dL — ABNORMAL HIGH (ref 8.4–10.5)
Creatinine, Ser: 0.7 mg/dL (ref 0.4–1.2)
GFR: 92.71 mL/min (ref >60.00)
GLUCOSE: 85 mg/dL (ref 70–99)
POTASSIUM: 4 meq/L (ref 3.5–5.1)
SODIUM: 136 meq/L (ref 135–145)

## 2013-06-14 LAB — CBC WITH DIFFERENTIAL/PLATELET
BASOS ABS: 0.1 10*3/uL (ref 0.0–0.1)
Basophils Relative: 1 % (ref 0.0–3.0)
EOS ABS: 0.1 10*3/uL (ref 0.0–0.7)
EOS PCT: 1.4 % (ref 0.0–5.0)
HEMATOCRIT: 40 % (ref 36.0–46.0)
HEMOGLOBIN: 13.2 g/dL (ref 12.0–15.0)
LYMPHS ABS: 2.2 10*3/uL (ref 0.7–4.0)
LYMPHS PCT: 21.1 % (ref 12.0–46.0)
MCHC: 33 g/dL (ref 30.0–36.0)
MCV: 87 fl (ref 78.0–100.0)
MONOS PCT: 6.9 % (ref 3.0–12.0)
Monocytes Absolute: 0.7 10*3/uL (ref 0.1–1.0)
NEUTROS ABS: 7.1 10*3/uL (ref 1.4–7.7)
Neutrophils Relative %: 69.6 % (ref 43.0–77.0)
Platelets: 376 10*3/uL (ref 150.0–400.0)
RBC: 4.6 Mil/uL (ref 3.87–5.11)
RDW: 14.3 % (ref 11.5–14.6)
WBC: 10.3 10*3/uL (ref 4.5–10.5)

## 2013-06-14 LAB — HEPATIC FUNCTION PANEL
ALT: 22 U/L (ref 0–35)
AST: 24 U/L (ref 0–37)
Albumin: 3.9 g/dL (ref 3.5–5.2)
Alkaline Phosphatase: 60 U/L (ref 39–117)
BILIRUBIN DIRECT: 0 mg/dL (ref 0.0–0.3)
TOTAL PROTEIN: 7.2 g/dL (ref 6.0–8.3)
Total Bilirubin: 0.3 mg/dL (ref 0.3–1.2)

## 2013-06-14 LAB — LIPID PANEL
CHOLESTEROL: 146 mg/dL (ref 0–200)
HDL: 37.7 mg/dL — AB (ref >39.00)
LDL CALC: 71 mg/dL (ref 0–99)
Total CHOL/HDL Ratio: 4
Triglycerides: 187 mg/dL — ABNORMAL HIGH (ref 0.0–149.0)
VLDL: 37.4 mg/dL (ref 0.0–40.0)

## 2013-06-14 LAB — TSH: TSH: 1.43 u[IU]/mL (ref 0.35–5.50)

## 2013-06-14 LAB — ANTI-NUCLEAR AB-TITER (ANA TITER): ANA TITER 1: NEGATIVE (ref <1:40)

## 2013-06-14 LAB — ANA: ANA: POSITIVE — AB

## 2013-06-15 ENCOUNTER — Encounter: Payer: Self-pay | Admitting: General Practice

## 2013-06-17 LAB — VITAMIN D 1,25 DIHYDROXY
VITAMIN D 1, 25 (OH) TOTAL: 106 pg/mL — AB (ref 18–72)
Vitamin D2 1, 25 (OH)2: <8 pg/mL
Vitamin D3 1, 25 (OH)2: 106 pg/mL

## 2013-06-19 ENCOUNTER — Encounter: Payer: Self-pay | Admitting: Family Medicine

## 2013-06-20 ENCOUNTER — Encounter: Payer: Self-pay | Admitting: General Practice

## 2013-06-20 ENCOUNTER — Telehealth: Payer: Self-pay | Admitting: Family Medicine

## 2013-06-20 NOTE — Telephone Encounter (Signed)
Patient called stating she discussed having a tetanus shot at her last visit with Dr. Birdie Riddle. Can you review the chart and see if this is appropriate and let me know? Pt is currently scheduled for Friday 06/24/13.

## 2013-06-20 NOTE — Telephone Encounter (Signed)
Order placed per tabori

## 2013-06-20 NOTE — Telephone Encounter (Signed)
It's fine, pt was due in 2007 for the Td booster.

## 2013-06-24 ENCOUNTER — Other Ambulatory Visit: Payer: 59

## 2013-06-24 ENCOUNTER — Ambulatory Visit (INDEPENDENT_AMBULATORY_CARE_PROVIDER_SITE_OTHER): Payer: 59 | Admitting: *Deleted

## 2013-06-24 DIAGNOSIS — Z23 Encounter for immunization: Secondary | ICD-10-CM

## 2013-06-27 LAB — PTH, INTACT AND CALCIUM
Calcium: 10 mg/dL (ref 8.4–10.5)
PTH: 142.9 pg/mL — ABNORMAL HIGH (ref 14.0–72.0)

## 2013-07-01 ENCOUNTER — Telehealth: Payer: Self-pay | Admitting: Family Medicine

## 2013-07-01 DIAGNOSIS — E213 Hyperparathyroidism, unspecified: Secondary | ICD-10-CM

## 2013-07-01 NOTE — Telephone Encounter (Signed)
Caller name:Marbeth Relation to PQ:AESL Call back [phone] Pharmacy:  Reason for call: pt called in to check status of referral for endocrinologist.  Pt says she spoke with Angie or someone with a similar name.  Did not see any notations in the chart.  Please advise.

## 2013-07-01 NOTE — Telephone Encounter (Signed)
Referral order placed.  Patient made aware.  No further questions or concerns voiced.

## 2013-07-05 ENCOUNTER — Encounter: Payer: Self-pay | Admitting: Family Medicine

## 2013-07-08 MED ORDER — ATOMOXETINE HCL 40 MG PO CAPS: ORAL_CAPSULE | ORAL | Status: DC

## 2013-07-08 NOTE — Telephone Encounter (Signed)
This med was filled.

## 2013-07-08 NOTE — Telephone Encounter (Signed)
Caller name:Londa Galyon Relation to ZM:[id number] Call back number:[phone]  Pharmacy:[facility]   Reason for call: patient states that dr Birdie Riddle was going to refill her strattera 40mg . Please advise.

## 2013-07-11 ENCOUNTER — Ambulatory Visit: Payer: 59 | Admitting: Family Medicine

## 2013-07-12 ENCOUNTER — Ambulatory Visit (INDEPENDENT_AMBULATORY_CARE_PROVIDER_SITE_OTHER): Payer: 59 | Admitting: Internal Medicine

## 2013-07-12 ENCOUNTER — Encounter: Payer: Self-pay | Admitting: Internal Medicine

## 2013-07-12 VITALS — BP 122/78 | HR 108 | Temp 99.4°F | Resp 12 | Ht 64.0 in | Wt 158.0 lb

## 2013-07-12 DIAGNOSIS — E213 Hyperparathyroidism, unspecified: Secondary | ICD-10-CM

## 2013-07-12 NOTE — Progress Notes (Addendum)
Patient ID: Amanda Fox, female   DOB: Jul 30, 1970, 43 y.o.   MRN: [id number]   HPI  Amanda Fox is a 43 y.o.-year-old female, referred by her PCP, Dr. Birdie Riddle, in consultation for hypercalcemia/hyperparathyroidism.  Pt was dx with hypercalcemia in 2012 (by Dr Hazle Coca) during investigation for palpitations. I reviewed pt's pertinent labs: Lab Results  Component Value Date   PTH 142.9* 06/24/2013   CALCIUM 10.0 06/24/2013   CALCIUM 10.6* 06/13/2013   CALCIUM 9.9 07/06/2009   No h/o vitamin D deficiency. Vit D levels not available for review. Her 1,25 diHO vitD was high, at 49 (18-72) at last check in 06/13/2013.  No previous DEXA scans. No fractures.  No h/o kidney stones.  No h/o CKD. Last BUN/Cr: Lab Results  Component Value Date   BUN 10 06/13/2013   CREATININE 0.7 06/13/2013   Pt is not on calcium and vitamin D - was taking a vit D3  (1000 IU) from 01/2013 to 04/2013; she was on MVI >> stopped in 04/062015. She eats dairy and green, leafy, vegetables. No weight bearing exercises, used to walk.   Pt does not have a FH of hypercalcemia, pituitary tumors, thyroid cancer; no osteoporosis, mother has osteopenia.  ROS: Constitutional: + weight gain and loss, + fatigue, + feeling cold Eyes: no blurry vision, no xerophthalmia ENT: no sore throat, no nodules palpated in throat, + dysphagia at night/odynophagia,  hoarseness, + hypoacusis Cardiovascular: no CP/SOB/+ palpitations/+ leg swelling Respiratory: no cough/SOB Gastrointestinal: no N/V/D/C, + heartburn Musculoskeletal: + muscle aches/+ joint aches Skin: + rash (hives), + itching, + hair loss Neurological: no tremors/numbness/tingling/dizziness, + HA Psychiatric: no depression/anxiety  Past Medical History  Diagnosis Date  . Seasonal allergies    Past Surgical History  Procedure Laterality Date  . Myomectomy  2007   History   Social History  . Marital Status: Married    Spouse Name: N/A    Number of Children:  0   Occupational History  . Psychotherapist   Social History Main Topics  . Smoking status: Never Smoker   . Smokeless tobacco: Never Used  . Alcohol Use: Yes     Comment: rare  . Drug Use: No  . Sexual Activity: Yes   Current Outpatient Prescriptions on File Prior to Visit  Medication Sig Dispense Refill  . atomoxetine (STRATTERA) 40 MG capsule Take 1 one tablet by mouth daily for 2 weeks. Then 1 pill every other day for two weeks. Then stop medication.  21 capsule  0   No current facility-administered medications on file prior to visit.   No Known Allergies Family History  Problem Relation Age of Onset  . Parkinsonism Mother   . Kidney disease Father   . Heart disease Maternal Grandmother   . Cancer Maternal Grandfather     pancreatic  . Kidney disease Paternal Grandmother   Has FH of Polycystic kidney ds.  PE: BP 122/78  Pulse 108  Temp(Src) 99.4 F (37.4 C) (Oral)  Resp 12  Ht 5\' 4"  (1.626 m)  Wt 158 lb (71.668 kg)  BMI 27.11 kg/m2  SpO2 97% Wt Readings from Last 3 Encounters:  07/12/13 158 lb (71.668 kg)  06/13/13 158 lb 4 oz (71.782 kg)  01/14/13 157 lb 4 oz (71.328 kg)   Constitutional: overweight, in NAD. Eyes: PERRLA, EOMI, no exophthalmos ENT: moist mucous membranes, no thyromegaly, no cervical lymphadenopathy Cardiovascular: RRR, No MRG Respiratory: CTA B Gastrointestinal: abdomen soft, NT, ND, BS+ Musculoskeletal: no deformities, strength intact in  all 4 Skin: moist, warm, no rashes Neurological: no tremor with outstretched hands, DTR normal in all 4  Assessment: 1. Hypercalcemia/hyperparathyroidism  Plan: Patient has had several instances of elevated calcium, with the highest level being at 10.6 per the available records. A PTH was high, at 143, for a calcium of 10.  It is unclear whether she has vitamin D deficiency, however, it is very likely that she does have a parathyroid adenoma based on the high PTH level with a borderline/high calcium.   No apparent complications from hypercalcemia: no h/o nephrolithiasis, no osteoporosis, no fractures. No abdominal pain, depression, bone pain. She has some nonspecific aches throughout her body, unclear if related to the high PTH. - I discussed with the patient about the physiology of calcium and parathyroid hormone, and possible side effects from increased PTH, including kidney stones, osteoporosis, abdominal pain, etc.  - we discussed about criteria for parathyroid surgery:  Increased calcium by more than 1 mg/dL above the upper limit of normal  Kidney ds.  Osteoporosis  Age <32 years old - she does meet the age criterion - I will check: calcium level intact PTH - [facility] Magnesium Phosphorus vitamin D 24hurinary calcium/creatinine ratio  - If the tests indicate a parathyroid adenoma, I will refer her to surgery. I explained what the parathyroid sx entails, and she agrees with the plan. - I will see the patient back in 4 months  Component     Latest Ref Rng 07/06/2009 06/13/2013 06/24/2013 07/12/2013 07/12/2013            4:51 PM  4:51 PM  Calcium     8.7 - 10.2 mg/dL 9.9 10.6 (H) 10.0  10.3 (H)  PTH     15 - 65 pg/mL   142.9 (H)  73 (H)  Vit D, 25-Hydroxy     30 - 89 ng/mL 35   35   Magnesium     1.5 - 2.5 mg/dL    2.0   Phosphorus     2.3 - 4.6 mg/dL    2.5    Awaiting urinary calcium. Component     Latest Ref Rng 07/25/2013  Calcium, Ur      17  Urine Calcium     100 - 250 mg/day 289 (H)   Urine calcium is high (creatinine was not run).  I will refer her to surgery.  Per pt's request, we repeated the urine collection >> calcium normal now: Component     Latest Ref Rng 08/08/2013  Calcium, Ur      12  Urine calcium     100 - 250 mg/day 192  Creatinine, Urine      77.8  Creatinine, 24H Ur     700 - 1800 mg/day 1245   I still believe she has primary hyperparathyroidism. She is 43 y/o, so still a candidate for surgery >> will d/w pt.

## 2013-07-12 NOTE — Patient Instructions (Signed)
Please return in 4 months.  Please stop at the lab.  Please collect the 24h urine and bring it to the lab.  Patient information (Up-to-Date): Collection of a 24-hour urine specimen   - You should collect every drop of urine during each 24-hour period. It does not matter how much or little urine is passed each time, as long as every drop is collected. - Begin the urine collection in the morning after you wake up, after you have emptied your bladder for the first time. - Urinate (empty the bladder) for the first time and flush it down the toilet. Note the exact time (eg, 6:15 AM). You will begin the urine collection at this time. - Collect every drop of urine during the day and night in an empty collection bottle. Store the bottle at room temperature or in the refrigerator. - If you need to have a bowel movement, any urine passed with the bowel movement should be collected. Try not to include feces with the urine collection. If feces does get mixed in, do not try to remove the feces from the urine collection bottle. - Finish by collecting the first urine passed the next morning, adding it to the collection bottle. This should be within ten minutes before or after the time of the first morning void on the first day (which was flushed). In this example, you would try to void between 6:05 and 6:25 on the second day. - If you need to urinate one hour before the final collection time, drink a full glass of water so that you can void again at the appropriate time. If you have to urinate 20 minutes before, try to hold the urine until the proper time. - Please note the exact time of the final collection, even if it is not the same time as when collection began on day 1. - The bottle(s) may be kept at room temperature for a day or two, but should be kept cool or refrigerated for longer periods of time.

## 2013-07-13 LAB — MAGNESIUM: MAGNESIUM: 2 mg/dL (ref 1.5–2.5)

## 2013-07-13 LAB — PHOSPHORUS: Phosphorus: 2.5 mg/dL (ref 2.3–4.6)

## 2013-07-14 LAB — PTH, INTACT AND CALCIUM
CALCIUM: 10.3 mg/dL — AB (ref 8.7–10.2)
PTH: 73 pg/mL — AB (ref 15–65)

## 2013-07-14 LAB — VITAMIN D 25 HYDROXY (VIT D DEFICIENCY, FRACTURES): VIT D 25 HYDROXY: 35 ng/mL (ref 30–89)

## 2013-07-15 ENCOUNTER — Encounter: Payer: Self-pay | Admitting: Family Medicine

## 2013-07-15 ENCOUNTER — Ambulatory Visit (INDEPENDENT_AMBULATORY_CARE_PROVIDER_SITE_OTHER): Payer: 59 | Admitting: Family Medicine

## 2013-07-15 VITALS — BP 118/76 | HR 100 | Temp 98.2°F | Resp 16 | Wt 157.4 lb

## 2013-07-15 DIAGNOSIS — F988 Other specified behavioral and emotional disorders with onset usually occurring in childhood and adolescence: Secondary | ICD-10-CM

## 2013-07-15 DIAGNOSIS — F900 Attention-deficit hyperactivity disorder, predominantly inattentive type: Secondary | ICD-10-CM

## 2013-07-15 DIAGNOSIS — E213 Hyperparathyroidism, unspecified: Secondary | ICD-10-CM

## 2013-07-15 DIAGNOSIS — R000 Tachycardia, unspecified: Secondary | ICD-10-CM

## 2013-07-15 NOTE — Progress Notes (Signed)
   Subjective:    Patient ID: Amanda Fox, female    DOB: Dec 13, 1970, 43 y.o.   MRN: [id number]  HPI ADD- is weaning off Stratera, having some mild headaches in the process.  Pt reports she is having increased inattentive sxs in the last 2 yrs.  Hyperparathyroid- new dx for pt, seeing Endo now for this.   Review of Systems For ROS see HPI     Objective:   Physical Exam  Vitals reviewed. Constitutional: She is oriented to person, place, and time. She appears well-developed and well-nourished. No distress.  HENT:  Head: Normocephalic and atraumatic.  Eyes: Conjunctivae and EOM are normal. Pupils are equal, round, and reactive to light.  Neck: Normal range of motion. Neck supple. No thyromegaly present.  Cardiovascular: Normal rate, regular rhythm, normal heart sounds and intact distal pulses.   No murmur heard. Pulmonary/Chest: Effort normal and breath sounds normal. No respiratory distress.  Abdominal: Soft. She exhibits no distension. There is no tenderness.  Musculoskeletal: She exhibits no edema.  Lymphadenopathy:    She has no cervical adenopathy.  Neurological: She is alert and oriented to person, place, and time.  Skin: Skin is warm and dry.  Psychiatric: She has a normal mood and affect. Her behavior is normal.          Assessment & Plan:

## 2013-07-15 NOTE — Progress Notes (Signed)
Pre visit review using our clinic review tool, if applicable. No additional management support is needed unless otherwise documented below in the visit note. 

## 2013-07-15 NOTE — Patient Instructions (Signed)
If and when your repeat labs confirm hyperparathyroidism, please call Endo and schedule an appt to discuss next steps/treatment options Continue to wean the Strattera No new meds at this time Call with any questions or concerns Hang in there!!

## 2013-07-15 NOTE — Assessment & Plan Note (Signed)
Ongoing issue- following w/ Endo.  May be playing a role in pt's tachycardia (although this has improved since stopping the stimulant)

## 2013-07-15 NOTE — Assessment & Plan Note (Signed)
Improved since stopping stimulant.  Will continue to monitor.

## 2013-07-15 NOTE — Assessment & Plan Note (Signed)
Pt is weaning Strattera and doing well w/ this.  No new meds at this time since HR has decreased 21 points since stopping stimulant.  Will continue to follow.

## 2013-07-25 ENCOUNTER — Other Ambulatory Visit: Payer: 59

## 2013-07-27 ENCOUNTER — Telehealth: Payer: Self-pay | Admitting: *Deleted

## 2013-07-27 ENCOUNTER — Other Ambulatory Visit: Payer: Self-pay | Admitting: *Deleted

## 2013-07-27 NOTE — Telephone Encounter (Signed)
Called [facility] back. (ref #[id number]) They were unable to do the creatinine clearance, urine 24 hr b/c there was not a SST creatinine drawn at the time. According to the cust serv rep, to result there needs a SST creatinine drawn along with the 24 hr urine. Lab order cancelled. Please advise.

## 2013-07-28 LAB — CALCIUM, URINE, 24 HOUR
Calcium, 24 hour urine: 289 mg/d — ABNORMAL HIGH (ref 100–250)
Calcium, Ur: 17 mg/dL

## 2013-07-29 ENCOUNTER — Encounter: Payer: Self-pay | Admitting: Internal Medicine

## 2013-07-29 ENCOUNTER — Encounter: Payer: Self-pay | Admitting: Family Medicine

## 2013-08-05 ENCOUNTER — Other Ambulatory Visit: Payer: 59

## 2013-08-05 ENCOUNTER — Other Ambulatory Visit: Payer: Self-pay | Admitting: Internal Medicine

## 2013-08-05 DIAGNOSIS — E213 Hyperparathyroidism, unspecified: Secondary | ICD-10-CM

## 2013-08-08 ENCOUNTER — Encounter: Payer: Self-pay | Admitting: Family Medicine

## 2013-08-08 ENCOUNTER — Other Ambulatory Visit: Payer: 59

## 2013-08-08 DIAGNOSIS — E213 Hyperparathyroidism, unspecified: Secondary | ICD-10-CM

## 2013-08-09 ENCOUNTER — Encounter: Payer: Self-pay | Admitting: Internal Medicine

## 2013-08-09 LAB — CREATININE, URINE, 24 HOUR
Creatinine, 24H Ur: 1245 mg/d (ref 700–1800)
Creatinine, Urine: 77.8 mg/dL

## 2013-08-09 LAB — CALCIUM, URINE, 24 HOUR
CALCIUM 24HR UR: 192 mg/d (ref 100–250)
Calcium, Ur: 12 mg/dL

## 2013-08-09 NOTE — Addendum Note (Signed)
Addended by: Philemon Kingdom on: 08/09/2013 08:06 AM   Modules accepted: Level of Service

## 2013-08-26 ENCOUNTER — Ambulatory Visit (INDEPENDENT_AMBULATORY_CARE_PROVIDER_SITE_OTHER): Payer: 59 | Admitting: Surgery

## 2013-08-26 ENCOUNTER — Encounter (INDEPENDENT_AMBULATORY_CARE_PROVIDER_SITE_OTHER): Payer: Self-pay | Admitting: Surgery

## 2013-08-26 VITALS — BP 108/70 | HR 88 | Temp 98.5°F | Resp 18 | Ht 64.0 in | Wt 155.0 lb

## 2013-08-26 DIAGNOSIS — E213 Hyperparathyroidism, unspecified: Secondary | ICD-10-CM

## 2013-08-26 NOTE — Patient Instructions (Signed)
Parathyroid Hormone This is a test to determine whether PTH levels are responding normally to changes in blood calcium levels. It also helps to distinguish the cause of calcium imbalances, and to evaluate parathyroid function. When calcium blood levels are higher or lower than normal, and when your caregiver may want to determine how well your parathyroid glands are working. Parathyroid hormone (PTH) helps the body maintain stable levels of calcium in the blood. It is part of a 'feedback loop' that includes calcium, PTH, vitamin D, and, to some extent, phosphate and magnesium. Conditions and diseases that disrupt this feedback loop can cause inappropriate elevations or decreases in calcium and PTH levels and lead to symptoms of hypercalcemia (raised blood levels of calcium) or hypocalcemia (low blood levels of calcium).  PTH is produced by four parathyroid glands that are located in the neck beside the thyroid gland. Normally, these glands secrete PTH into the bloodstream in response to low blood calcium levels. Parathyroid hormone then works in three ways to help raise blood calcium levels back to normal. It takes calcium from the body's bone, stimulates the activation of vitamin D in the kidney (which in turn increases the absorption of calcium from the intestines), and suppresses the excretion of calcium in the urine (while encouraging excretion of phosphate). As calcium levels begin to increase in the blood, PTH normally decreases. PREPARATION FOR TEST You should have nothing to eat or drink except for water after midnight on the day of the test or as directed by your caregiver. A blood sample is obtained by inserting a needle into a vein in the arm. NORMAL FINDINGS Conventional Normal  PTH intact (whole)  Assay includes intact PTH  Values (pg/mL)10-65  SI Units (ng/L)10-65  PTH N-terminalN-terminal  Values (pg/mL) 8-24  SI Units (ng/L)8-24  PTH C-terminal  Assay Includes  C-terminal  Values (pg/mL) 50-330  SI Units (ng/L) 50-330  Intact PTH  Midmolecule Ranges for normal findings may vary among different laboratories and hospitals. You should always check with your doctor after having lab work or other tests done to discuss the meaning of your test results and whether your values are considered within normal limits. MEANING OF TEST  Your caregiver will go over the test results with you and discuss the importance and meaning of your results, as well as treatment options and the need for additional tests if necessary. OBTAINING THE TEST RESULTS  It is your responsibility to obtain your test results. Ask the lab or department performing the test when and how you will get your results. Document Released: 03/29/2004 Document Revised: 05/19/2011 Document Reviewed: 02/06/2008 [facility] Patient Information 2015 [place], [place]. This information is not intended to replace advice given to you by your health care provider. Make sure you discuss any questions you have with your health care provider.

## 2013-08-26 NOTE — Progress Notes (Signed)
General Surgery [facility]  Chief Complaint  Patient presents with  . New Evaluation    eval hyperparathyroidism - referral from Dr. Annye Asa    HISTORY: Patient is a 43 year old female referred by her endocrinologist (Dr. Philemon Kingdom) and primary care doctor (Dr. Annye Asa) for evaluation of suspected primary hyperparathyroidism. Patient had originally been noted to have an elevated calcium level several years ago. She developed mild bone and joint pain. She has had frequent urination. She denies nephrolithiasis. She has had some problems with short-term memory issues. She complains of chronic fatigue.  Recent laboratory studies show an elevated serum calcium level ranging from 10.0-10.6. Intact PTH level was elevated ranging from 73-142.9. 25 hydroxy vitamin D level is normal at 35. 24-hour urine collection showed an elevated excretion of calcium 289 mg per day. Patient has not had any imaging studies. She is referred for further evaluation and recommendations.  Patient has no prior history of head or neck surgery. She has not had a bone density scan. There is no family history of parathyroid disease or other endocrine neoplasm.  Past Medical History  Diagnosis Date  . Seasonal allergies     Current Outpatient Prescriptions  Medication Sig Dispense Refill  . amphetamine-dextroamphetamine (ADDERALL) 10 MG tablet Take 10 mg by mouth daily with breakfast.      . FINACEA 15 % cream        No current facility-administered medications for this visit.    No Known Allergies  Family History  Problem Relation Age of Onset  . Parkinsonism Mother   . Kidney disease Father   . Heart disease Maternal Grandmother   . Cancer Maternal Grandfather     pancreatic  . Kidney disease Paternal Grandmother     History   Social History  . Marital Status: Married    Spouse Name: N/A    Number of Children: N/A  . Years of Education: N/A   Social  History Main Topics  . Smoking status: Never Smoker   . Smokeless tobacco: Never Used  . Alcohol Use: No     Comment: rare  . Drug Use: No  . Sexual Activity: Yes   Other Topics Concern  . None   Social History Narrative  . None    REVIEW OF SYSTEMS - PERTINENT POSITIVES ONLY: Chronic fatigue. Frequent urination. Bone and joint pain. Memory loss.  EXAM: Filed Vitals:   08/26/13 1336  BP: 108/70  Pulse: 88  Temp: 98.5 F (36.9 C)  Resp: 18    GENERAL: well-developed, well-nourished, no acute distress HEENT: normocephalic; pupils equal and reactive; sclerae clear; dentition good; mucous membranes moist NECK:  No palpable masses in the thyroid bed; symmetric on extension; no palpable anterior or posterior cervical lymphadenopathy; no supraclavicular masses; no tenderness CHEST: clear to auscultation bilaterally without rales, rhonchi, or wheezes CARDIAC: regular rate and rhythm without significant murmur; peripheral pulses are full EXT:  non-tender without edema; no deformity NEURO: no gross focal deficits; no sign of tremor   LABORATORY RESULTS: See [facility] (CHL-Epic) for most recent results  RADIOLOGY RESULTS: See [facility] (CHL-Epic) for most recent results  IMPRESSION: Probable primary hyperparathyroidism  PLAN: I discussed the above findings at length with the patient and her husband. I provided him with written literature to review at home. I would like to proceed with a nuclear medicine parathyroid scan in the near future. We discussed the study and the fact that it was approximately 80% successful in  localizing a parathyroid adenoma. If the scan is negative, we will obtain an ultrasound of the neck. I will contact her with the results. If we can localize the parathyroid adenoma, I believe she would be an excellent candidate for minimally invasive surgery.  We discussed the possibility of minimally invasive surgery. We discussed the risk and benefits  of the procedure including the possibility for recurrent laryngeal nerve injury.  Patient understands and agrees to proceed with further evaluation.  We will contact her once her radiographic studies are completed.  Earnstine Regal, MD, [facility]  Primary Care Physician: Annye Asa, MD

## 2013-08-31 ENCOUNTER — Ambulatory Visit (HOSPITAL_COMMUNITY): Payer: 59

## 2013-09-12 ENCOUNTER — Encounter (HOSPITAL_COMMUNITY): Payer: 59

## 2013-09-12 ENCOUNTER — Encounter (HOSPITAL_COMMUNITY)
Admission: RE | Admit: 2013-09-12 | Discharge: 2013-09-12 | Disposition: A | Discharge status: Home / Self Care | Payer: 59 | Source: Ambulatory Visit | Attending: Diagnostic Radiology | Admitting: Diagnostic Radiology

## 2013-09-12 DIAGNOSIS — E213 Hyperparathyroidism, unspecified: Secondary | ICD-10-CM

## 2013-09-12 DIAGNOSIS — D351 Benign neoplasm of parathyroid gland: Secondary | ICD-10-CM | POA: Insufficient documentation

## 2013-09-12 DIAGNOSIS — R6889 Other general symptoms and signs: Secondary | ICD-10-CM | POA: Diagnosis present

## 2013-09-12 MED ORDER — TECHNETIUM TC 99M SESTAMIBI GENERIC - CARDIOLITE
25.0000 | Freq: Once | INTRAVENOUS | Status: AC | PRN
  Administered 2013-09-12: 25 via INTRAVENOUS

## 2013-09-16 ENCOUNTER — Ambulatory Visit (HOSPITAL_COMMUNITY): Payer: 59

## 2013-09-16 ENCOUNTER — Encounter (HOSPITAL_COMMUNITY): Payer: 59

## 2013-09-19 ENCOUNTER — Encounter (INDEPENDENT_AMBULATORY_CARE_PROVIDER_SITE_OTHER): Payer: Self-pay | Admitting: Surgery

## 2013-09-21 ENCOUNTER — Other Ambulatory Visit (INDEPENDENT_AMBULATORY_CARE_PROVIDER_SITE_OTHER): Payer: Self-pay | Admitting: Surgery

## 2013-09-21 ENCOUNTER — Encounter (INDEPENDENT_AMBULATORY_CARE_PROVIDER_SITE_OTHER): Payer: Self-pay | Admitting: Surgery

## 2013-09-21 NOTE — Progress Notes (Signed)
Quick Note:  Sestamibi scan localizes to left lower gland. Good candidate for minimally invasive surgery.  Earnstine Regal, MD, [facility] Office: [phone]    ______

## 2013-10-03 ENCOUNTER — Encounter (HOSPITAL_COMMUNITY): Payer: Self-pay | Admitting: Pharmacy Technician

## 2013-10-05 ENCOUNTER — Encounter (HOSPITAL_COMMUNITY)
Admission: RE | Admit: 2013-10-05 | Discharge: 2013-10-05 | Disposition: A | Discharge status: Home / Self Care | Payer: 59 | Source: Ambulatory Visit | Attending: Surgery | Admitting: Surgery

## 2013-10-05 ENCOUNTER — Encounter (HOSPITAL_COMMUNITY): Payer: Self-pay

## 2013-10-05 DIAGNOSIS — L719 Rosacea, unspecified: Secondary | ICD-10-CM | POA: Diagnosis not present

## 2013-10-05 DIAGNOSIS — E210 Primary hyperparathyroidism: Secondary | ICD-10-CM | POA: Diagnosis present

## 2013-10-05 DIAGNOSIS — Z01812 Encounter for preprocedural laboratory examination: Secondary | ICD-10-CM | POA: Diagnosis not present

## 2013-10-05 DIAGNOSIS — D649 Anemia, unspecified: Secondary | ICD-10-CM | POA: Diagnosis not present

## 2013-10-05 DIAGNOSIS — F909 Attention-deficit hyperactivity disorder, unspecified type: Secondary | ICD-10-CM | POA: Diagnosis not present

## 2013-10-05 DIAGNOSIS — Z79899 Other long term (current) drug therapy: Secondary | ICD-10-CM | POA: Diagnosis not present

## 2013-10-05 DIAGNOSIS — K219 Gastro-esophageal reflux disease without esophagitis: Secondary | ICD-10-CM | POA: Diagnosis not present

## 2013-10-05 HISTORY — DX: Personal history of other specified conditions: Z87.898

## 2013-10-05 HISTORY — DX: Anemia, unspecified: D64.9

## 2013-10-05 HISTORY — DX: Allergy status to unspecified drugs, medicaments and biological substances: Z88.9

## 2013-10-05 HISTORY — DX: Personal history of other mental and behavioral disorders: Z86.59

## 2013-10-05 HISTORY — DX: Myoneural disorder, unspecified: G70.9

## 2013-10-05 HISTORY — DX: Pain, unspecified: R52

## 2013-10-05 HISTORY — DX: Rosacea, unspecified: L71.9

## 2013-10-05 HISTORY — DX: Headache: R51

## 2013-10-05 LAB — CBC
HCT: 39.6 % (ref 36.0–46.0)
Hemoglobin: 12.9 g/dL (ref 12.0–15.0)
MCH: 27.9 pg (ref 26.0–34.0)
MCHC: 32.6 g/dL (ref 30.0–36.0)
MCV: 85.5 fL (ref 78.0–100.0)
Platelets: 404 10*3/uL — ABNORMAL HIGH (ref 150–400)
RBC: 4.63 MIL/uL (ref 3.87–5.11)
RDW: 13.5 % (ref 11.5–15.5)
WBC: 9.4 10*3/uL (ref 4.0–10.5)

## 2013-10-05 LAB — HCG, SERUM, QUALITATIVE: PREG SERUM: NEGATIVE

## 2013-10-05 NOTE — Patient Instructions (Addendum)
20 BLANCHE SCOVELL  10/05/2013   Your procedure is scheduled on: 7-31  -2015 Friday at 1200 noon.  Enter through [facility] Entrance and follow signs to [facility]. Arrive at   1000     AM .  Call this number if you have problems the morning of surgery: [phone]  Or Presurgical Testing [phone](Scheryl Sanborn) For Living Will and/or Health Care Power Attorney Forms: please provide copy for your medical record,may bring AM of surgery(Forms should be already notarized -we do not provide this service).(10-05-13 Yes,  information preferred today and given).  Remember:    Do not eat food:After Midnight.  May have clear liquids:up to 6 Hours before arrival. Nothing after : 0600 AM.  Clear liquids include soda, tea, black coffee, apple or grape juice, broth.  Take these medicines the morning of surgery with A SIP OF WATER: none.   Do not wear jewelry, make-up or nail polish.  Do not wear lotions, powders, or perfumes. You may wear deodorant.  Do not shave 48 hours(2 days) prior to first CHG shower(legs and under arms).(Shaving face and neck okay.)  Do not bring valuables to the hospital.(Hospital is not responsible for lost valuables).  Contacts, dentures or removable bridgework, body piercing, hair pins may not be worn into surgery.  Leave suitcase in the car. After surgery it may be brought to your room.  For patients admitted to the hospital, checkout time is 11:00 AM the day of discharge.(Restricted visitors-Any Persons displaying flu-like symptoms or illness).    Patients discharged the day of surgery will not be allowed to drive home. Must have responsible person with you x 24 hours once discharged.  Name and phone number of your driver: Offie Pickron, spouse [phone] cell  Special Instructions: CHG(Chlorhedine 4%-"Hibiclens","Betasept","Aplicare") Shower Use Special Wash: see special instructions.(avoid face and genitals)     _____________    [facility] - Preparing  for Surgery Before surgery, you can play an important role.  Because skin is not sterile, your skin needs to be as free of germs as possible.  You can reduce the number of germs on your skin by washing with CHG (chlorahexidine gluconate) soap before surgery.  CHG is an antiseptic cleaner which kills germs and bonds with the skin to continue killing germs even after washing. Please DO NOT use if you have an allergy to CHG or antibacterial soaps.  If your skin becomes reddened/irritated stop using the CHG and inform your nurse when you arrive at Short Stay. Do not shave (including legs and underarms) for at least 48 hours prior to the first CHG shower.  You may shave your face/neck. Please follow these instructions carefully:  1.  Shower with CHG Soap the night before surgery and the  morning of Surgery.  2.  If you choose to wash your hair, wash your hair first as usual with your  normal  shampoo.  3.  After you shampoo, rinse your hair and body thoroughly to remove the  shampoo.                           4.  Use CHG as you would any other liquid soap.  You can apply chg directly  to the skin and wash                       Gently with a scrungie or clean washcloth.  5.  Apply the  CHG Soap to your body ONLY FROM THE NECK DOWN.   Do not use on face/ open                           Wound or open sores. Avoid contact with eyes, ears mouth and genitals (private parts).                       Wash face,  Genitals (private parts) with your normal soap.             6.  Wash thoroughly, paying special attention to the area where your surgery  will be performed.  7.  Thoroughly rinse your body with warm water from the neck down.  8.  DO NOT shower/wash with your normal soap after using and rinsing off  the CHG Soap.                9.  Pat yourself dry with a clean towel.            10.  Wear clean pajamas.            11.  Place clean sheets on your bed the night of your first shower and do not  sleep with  pets. Day of Surgery : Do not apply any lotions/deodorants the morning of surgery.  Please wear clean clothes to the hospital/surgery center.  FAILURE TO FOLLOW THESE INSTRUCTIONS MAY RESULT IN THE CANCELLATION OF YOUR SURGERY PATIENT SIGNATURE_________________________________  NURSE SIGNATURE__________________________________  ________________________________________________________________________

## 2013-10-06 NOTE — Progress Notes (Signed)
Quick Note:  These results are acceptable for scheduled surgery.  Tekela Garguilo M. Loxley Cibrian, MD, [facility] Office: [phone]   ______ 

## 2013-10-07 ENCOUNTER — Encounter (HOSPITAL_COMMUNITY): Payer: Self-pay | Admitting: *Deleted

## 2013-10-07 ENCOUNTER — Encounter (HOSPITAL_COMMUNITY): Payer: 59 | Admitting: Certified Registered Nurse Anesthetist

## 2013-10-07 ENCOUNTER — Observation Stay (HOSPITAL_COMMUNITY)
Admission: RE | Admit: 2013-10-07 | Discharge: 2013-10-08 | Disposition: A | Discharge status: Home / Self Care | Payer: 59 | Source: Ambulatory Visit | Attending: Surgery | Admitting: Surgery

## 2013-10-07 ENCOUNTER — Encounter (HOSPITAL_COMMUNITY)
Admission: RE | Disposition: A | Discharge status: Home / Self Care | Payer: Self-pay | Source: Ambulatory Visit | Attending: Surgery

## 2013-10-07 ENCOUNTER — Ambulatory Visit (HOSPITAL_COMMUNITY): Payer: 59 | Admitting: Certified Registered Nurse Anesthetist

## 2013-10-07 DIAGNOSIS — E210 Primary hyperparathyroidism: Secondary | ICD-10-CM | POA: Diagnosis not present

## 2013-10-07 DIAGNOSIS — K219 Gastro-esophageal reflux disease without esophagitis: Secondary | ICD-10-CM | POA: Insufficient documentation

## 2013-10-07 DIAGNOSIS — Z01812 Encounter for preprocedural laboratory examination: Secondary | ICD-10-CM | POA: Insufficient documentation

## 2013-10-07 DIAGNOSIS — D649 Anemia, unspecified: Secondary | ICD-10-CM | POA: Insufficient documentation

## 2013-10-07 DIAGNOSIS — L719 Rosacea, unspecified: Secondary | ICD-10-CM | POA: Insufficient documentation

## 2013-10-07 DIAGNOSIS — Z8639 Personal history of other endocrine, nutritional and metabolic disease: Secondary | ICD-10-CM | POA: Diagnosis present

## 2013-10-07 DIAGNOSIS — Z79899 Other long term (current) drug therapy: Secondary | ICD-10-CM | POA: Insufficient documentation

## 2013-10-07 DIAGNOSIS — E213 Hyperparathyroidism, unspecified: Secondary | ICD-10-CM | POA: Diagnosis present

## 2013-10-07 DIAGNOSIS — F909 Attention-deficit hyperactivity disorder, unspecified type: Secondary | ICD-10-CM | POA: Insufficient documentation

## 2013-10-07 HISTORY — PX: PARATHYROIDECTOMY: SHX19

## 2013-10-07 LAB — BASIC METABOLIC PANEL
ANION GAP: 12 (ref 5–15)
BUN: 9 mg/dL (ref 6–23)
CO2: 23 meq/L (ref 19–32)
Calcium: 10.4 mg/dL (ref 8.4–10.5)
Chloride: 101 mEq/L (ref 96–112)
Creatinine, Ser: 0.89 mg/dL (ref 0.50–1.10)
GFR calc Af Amer: >90 mL/min (ref >90)
GFR calc non Af Amer: 79 mL/min — ABNORMAL LOW (ref >90)
GLUCOSE: 140 mg/dL — AB (ref 70–99)
POTASSIUM: 5.4 meq/L — AB (ref 3.7–5.3)
SODIUM: 136 meq/L — AB (ref 137–147)

## 2013-10-07 SURGERY — PARATHYROIDECTOMY
Anesthesia: General | Laterality: Left

## 2013-10-07 MED ORDER — KCL IN DEXTROSE-NACL 30-5-0.45 MEQ/L-%-% IV SOLN
INTRAVENOUS | Status: DC
  Administered 2013-10-07: 16:00:00 via INTRAVENOUS
  Filled 2013-10-07 (×5): qty 1000

## 2013-10-07 MED ORDER — METOCLOPRAMIDE HCL 5 MG/ML IJ SOLN
INTRAMUSCULAR | Status: AC
  Filled 2013-10-07: qty 2

## 2013-10-07 MED ORDER — HYDROMORPHONE HCL PF 1 MG/ML IJ SOLN
INTRAMUSCULAR | Status: AC
  Filled 2013-10-07: qty 1

## 2013-10-07 MED ORDER — ACETAMINOPHEN 10 MG/ML IV SOLN
1000.0000 mg | Freq: Once | INTRAVENOUS | Status: AC
  Administered 2013-10-07: 1000 mg via INTRAVENOUS
  Filled 2013-10-07: qty 100

## 2013-10-07 MED ORDER — PHENYLEPHRINE HCL 10 MG/ML IJ SOLN
INTRAMUSCULAR | Status: DC | PRN
  Administered 2013-10-07 (×2): 40 ug via INTRAVENOUS
  Administered 2013-10-07 (×3): 80 ug via INTRAVENOUS
  Administered 2013-10-07: 40 ug via INTRAVENOUS
  Administered 2013-10-07: 80 ug via INTRAVENOUS

## 2013-10-07 MED ORDER — PROPOFOL 10 MG/ML IV BOLUS
INTRAVENOUS | Status: AC
  Filled 2013-10-07: qty 20

## 2013-10-07 MED ORDER — GLYCOPYRROLATE 0.2 MG/ML IJ SOLN
INTRAMUSCULAR | Status: DC | PRN
  Administered 2013-10-07: 0.6 mg via INTRAVENOUS

## 2013-10-07 MED ORDER — GLYCOPYRROLATE 0.2 MG/ML IJ SOLN
INTRAMUSCULAR | Status: AC
  Filled 2013-10-07: qty 3

## 2013-10-07 MED ORDER — DEXAMETHASONE SODIUM PHOSPHATE 10 MG/ML IJ SOLN
INTRAMUSCULAR | Status: DC | PRN
  Administered 2013-10-07: 10 mg via INTRAVENOUS

## 2013-10-07 MED ORDER — MIDAZOLAM HCL 2 MG/2ML IJ SOLN
INTRAMUSCULAR | Status: AC
  Filled 2013-10-07: qty 2

## 2013-10-07 MED ORDER — NEOSTIGMINE METHYLSULFATE 10 MG/10ML IV SOLN
INTRAVENOUS | Status: AC
  Filled 2013-10-07: qty 1

## 2013-10-07 MED ORDER — CEFAZOLIN SODIUM-DEXTROSE 2-3 GM-% IV SOLR
2.0000 g | INTRAVENOUS | Status: AC
  Administered 2013-10-07: 2 g via INTRAVENOUS

## 2013-10-07 MED ORDER — NEOSTIGMINE METHYLSULFATE 10 MG/10ML IV SOLN
INTRAVENOUS | Status: DC | PRN
  Administered 2013-10-07: 5 mg via INTRAVENOUS

## 2013-10-07 MED ORDER — ACETAMINOPHEN 325 MG PO TABS: 650.0000 mg | ORAL_TABLET | ORAL | Status: DC | PRN

## 2013-10-07 MED ORDER — ROCURONIUM BROMIDE 100 MG/10ML IV SOLN
INTRAVENOUS | Status: DC | PRN
  Administered 2013-10-07: 5 mg via INTRAVENOUS
  Administered 2013-10-07: 30 mg via INTRAVENOUS

## 2013-10-07 MED ORDER — PROMETHAZINE HCL 25 MG/ML IJ SOLN: 6.2500 mg | INTRAMUSCULAR | Status: DC | PRN

## 2013-10-07 MED ORDER — CEFAZOLIN SODIUM-DEXTROSE 2-3 GM-% IV SOLR
INTRAVENOUS | Status: AC
  Filled 2013-10-07: qty 50

## 2013-10-07 MED ORDER — LACTATED RINGERS IV SOLN
INTRAVENOUS | Status: DC
  Administered 2013-10-07: 1000 mL via INTRAVENOUS
  Administered 2013-10-07: 13:00:00 via INTRAVENOUS

## 2013-10-07 MED ORDER — MIDAZOLAM HCL 5 MG/5ML IJ SOLN
INTRAMUSCULAR | Status: DC | PRN
  Administered 2013-10-07: 2 mg via INTRAVENOUS

## 2013-10-07 MED ORDER — HYDROMORPHONE HCL PF 1 MG/ML IJ SOLN
0.2500 mg | INTRAMUSCULAR | Status: DC | PRN
  Administered 2013-10-07 (×4): 0.5 mg via INTRAVENOUS

## 2013-10-07 MED ORDER — ONDANSETRON HCL 4 MG/2ML IJ SOLN
INTRAMUSCULAR | Status: AC
  Filled 2013-10-07: qty 2

## 2013-10-07 MED ORDER — SUCCINYLCHOLINE CHLORIDE 20 MG/ML IJ SOLN
INTRAMUSCULAR | Status: DC | PRN
  Administered 2013-10-07: 100 mg via INTRAVENOUS

## 2013-10-07 MED ORDER — ROCURONIUM BROMIDE 100 MG/10ML IV SOLN
INTRAVENOUS | Status: AC
  Filled 2013-10-07: qty 1

## 2013-10-07 MED ORDER — LACTATED RINGERS IV SOLN: INTRAVENOUS | Status: DC

## 2013-10-07 MED ORDER — CALCIUM CARBONATE 1250 (500 CA) MG PO TABS
2.0000 | ORAL_TABLET | Freq: Three times a day (TID) | ORAL | Status: DC
  Administered 2013-10-07: 1000 mg via ORAL
  Filled 2013-10-07 (×5): qty 2

## 2013-10-07 MED ORDER — LIDOCAINE HCL (CARDIAC) 20 MG/ML IV SOLN
INTRAVENOUS | Status: DC | PRN
  Administered 2013-10-07: 100 mg via INTRAVENOUS

## 2013-10-07 MED ORDER — FENTANYL CITRATE 0.05 MG/ML IJ SOLN
INTRAMUSCULAR | Status: DC | PRN
  Administered 2013-10-07 (×5): 50 ug via INTRAVENOUS

## 2013-10-07 MED ORDER — PHENYLEPHRINE 40 MCG/ML (10ML) SYRINGE FOR IV PUSH (FOR BLOOD PRESSURE SUPPORT)
PREFILLED_SYRINGE | INTRAVENOUS | Status: AC
  Filled 2013-10-07: qty 10

## 2013-10-07 MED ORDER — PROPOFOL 10 MG/ML IV BOLUS
INTRAVENOUS | Status: DC | PRN
  Administered 2013-10-07: 150 mg via INTRAVENOUS

## 2013-10-07 MED ORDER — METOCLOPRAMIDE HCL 5 MG/ML IJ SOLN
INTRAMUSCULAR | Status: DC | PRN
  Administered 2013-10-07: 10 mg via INTRAVENOUS

## 2013-10-07 MED ORDER — FENTANYL CITRATE 0.05 MG/ML IJ SOLN
INTRAMUSCULAR | Status: AC
  Filled 2013-10-07: qty 5

## 2013-10-07 MED ORDER — LIDOCAINE HCL (CARDIAC) 20 MG/ML IV SOLN
INTRAVENOUS | Status: AC
  Filled 2013-10-07: qty 5

## 2013-10-07 MED ORDER — ONDANSETRON HCL 4 MG/2ML IJ SOLN
INTRAMUSCULAR | Status: DC | PRN
  Administered 2013-10-07: 4 mg via INTRAVENOUS

## 2013-10-07 MED ORDER — DEXAMETHASONE SODIUM PHOSPHATE 10 MG/ML IJ SOLN
INTRAMUSCULAR | Status: AC
  Filled 2013-10-07: qty 1

## 2013-10-07 MED ORDER — HYDROCODONE-ACETAMINOPHEN 5-325 MG PO TABS
1.0000 | ORAL_TABLET | ORAL | Status: DC | PRN
  Administered 2013-10-08: 1 via ORAL
  Filled 2013-10-07: qty 1

## 2013-10-07 MED ORDER — HYDROMORPHONE HCL PF 1 MG/ML IJ SOLN
1.0000 mg | INTRAMUSCULAR | Status: DC | PRN
  Administered 2013-10-07 – 2013-10-08 (×3): 1 mg via INTRAVENOUS
  Filled 2013-10-07 (×3): qty 1

## 2013-10-07 SURGICAL SUPPLY — 36 items
ATTRACTOMAT 16X20 MAGNETIC DRP (DRAPES) ×3 IMPLANT
BENZOIN TINCTURE PRP APPL 2/3 (GAUZE/BANDAGES/DRESSINGS) ×3 IMPLANT
BLADE HEX COATED 2.75 (ELECTRODE) ×3 IMPLANT
BLADE SURG 15 STRL LF DISP TIS (BLADE) ×1 IMPLANT
BLADE SURG 15 STRL SS (BLADE) ×2
CANISTER SUCTION 2500CC (MISCELLANEOUS) ×3 IMPLANT
CHLORAPREP W/TINT 10.5 ML (MISCELLANEOUS) ×3 IMPLANT
CLIP TI MEDIUM 6 (CLIP) ×6 IMPLANT
CLIP TI WIDE RED SMALL 6 (CLIP) ×6 IMPLANT
CLOSURE WOUND 1/2 X4 (GAUZE/BANDAGES/DRESSINGS) ×1
DRAPE PED LAPAROTOMY (DRAPES) ×3 IMPLANT
DRESSING SURGICEL FIBRLLR 1X2 (HEMOSTASIS) ×1 IMPLANT
DRSG SURGICEL FIBRILLAR 1X2 (HEMOSTASIS) ×3
ELECT REM PT RETURN 9FT ADLT (ELECTROSURGICAL) ×3
ELECTRODE REM PT RTRN 9FT ADLT (ELECTROSURGICAL) ×1 IMPLANT
GAUZE SPONGE 4X4 16PLY XRAY LF (GAUZE/BANDAGES/DRESSINGS) ×3 IMPLANT
GLOVE SURG ORTHO 8.0 STRL STRW (GLOVE) ×3 IMPLANT
GOWN STRL REUS W/TWL XL LVL3 (GOWN DISPOSABLE) ×9 IMPLANT
KIT BASIN OR (CUSTOM PROCEDURE TRAY) ×3 IMPLANT
NEEDLE HYPO 25X1 1.5 SAFETY (NEEDLE) ×3 IMPLANT
NS IRRIG 1000ML POUR BTL (IV SOLUTION) ×3 IMPLANT
PACK BASIC VI WITH GOWN DISP (CUSTOM PROCEDURE TRAY) ×3 IMPLANT
PENCIL BUTTON HOLSTER BLD 10FT (ELECTRODE) ×3 IMPLANT
STAPLER VISISTAT 35W (STAPLE) IMPLANT
STRIP CLOSURE SKIN 1/2X4 (GAUZE/BANDAGES/DRESSINGS) ×2 IMPLANT
SUT MNCRL AB 4-0 PS2 18 (SUTURE) ×3 IMPLANT
SUT SILK 2 0 (SUTURE)
SUT SILK 2-0 18XBRD TIE 12 (SUTURE) IMPLANT
SUT SILK 3 0 (SUTURE)
SUT SILK 3-0 18XBRD TIE 12 (SUTURE) IMPLANT
SUT VIC AB 3-0 SH 18 (SUTURE) ×6 IMPLANT
SYR BULB IRRIGATION 50ML (SYRINGE) ×3 IMPLANT
SYR CONTROL 10ML LL (SYRINGE) ×3 IMPLANT
TOWEL OR 17X26 10 PK STRL BLUE (TOWEL DISPOSABLE) ×3 IMPLANT
TOWEL OR NON WOVEN STRL DISP B (DISPOSABLE) ×3 IMPLANT
YANKAUER SUCT BULB TIP 10FT TU (MISCELLANEOUS) ×3 IMPLANT

## 2013-10-07 NOTE — Brief Op Note (Signed)
10/07/2013  1:44 PM  PATIENT:  Amanda Fox  43 y.o. female  PRE-OPERATIVE DIAGNOSIS:  primary hyperparathyroidism  POST-OPERATIVE DIAGNOSIS:  primary hyperparathyroidism  PROCEDURE:  Procedure(s): NECK EXPLORATION SUBTOTAL PARATHYROIDECTOMY WITH AUTOTRASPLANTATION OF PARATHYRIOD TISSUE TO LEFT STERNOCLEIDOMASTOID MUSCLE (Left)  SURGEON:  Surgeon(s) and Role:    * Earnstine Regal, MD - Primary  ANESTHESIA:   general  EBL:  Total I/O In: 1000 [I.V.:1000] Out: -   BLOOD ADMINISTERED:none  DRAINS: none   LOCAL MEDICATIONS USED:  NONE  SPECIMEN:  Excision  DISPOSITION OF SPECIMEN:  PATHOLOGY  COUNTS:  YES  TOURNIQUET:  * No tourniquets in log *  DICTATION: .Other Dictation: Dictation Number [id number]  PLAN OF CARE: Admit for overnight observation  PATIENT DISPOSITION:  PACU - hemodynamically stable.   Delay start of Pharmacological VTE agent (>24hrs) due to surgical blood loss or risk of bleeding: yes  Earnstine Regal, MD, [facility] Office: [phone]

## 2013-10-07 NOTE — Transfer of Care (Signed)
Immediate Anesthesia Transfer of Care Note  Patient: Amanda Fox  Procedure(s) Performed: Procedure(s) (LRB): NECK EXPLORATION SUBTOTAL PARATHYROIDECTOMY WITH AUTOTRASPLANTATION OF PARATHYRIOD TISSUE TO LEFT STERNOCLEIDOMASTOID MUSCLE (Left)  Patient Location: PACU  Anesthesia Type: General  Level of Consciousness: sedated, patient cooperative and responds to stimulation  Airway & Oxygen Therapy: Patient Spontanous Breathing and Patient connected to face mask oxgen  Post-op Assessment: Report given to PACU RN and Post -op Vital signs reviewed and stable  Post vital signs: Reviewed and stable  Complications: No apparent anesthesia complications

## 2013-10-07 NOTE — Anesthesia Postprocedure Evaluation (Signed)
  Anesthesia Post-op Note  Patient: Amanda Fox  Procedure(s) Performed: Procedure(s): NECK EXPLORATION SUBTOTAL PARATHYROIDECTOMY WITH AUTOTRASPLANTATION OF PARATHYRIOD TISSUE TO LEFT STERNOCLEIDOMASTOID MUSCLE (Left)  Patient Location: PACU  Anesthesia Type:General  Level of Consciousness: awake, alert  and oriented  Airway and Oxygen Therapy: Patient Spontanous Breathing  Post-op Pain: moderate  Post-op Assessment: Post-op Vital signs reviewed  Post-op Vital Signs: Reviewed  Last Vitals:  Filed Vitals:   10/07/13 1407  BP:   Pulse: 65  Temp:   Resp: 20    Complications: No apparent anesthesia complications

## 2013-10-07 NOTE — H&P (Signed)
Amanda Fox is an 43 y.o. female.    General Surgery [facility]  Chief Complaint: primary hyperparathyroidism  HPI: Patient is a 43 year old female referred by her endocrinologist (Dr. Philemon Kingdom) and primary care doctor (Dr. Annye Asa) for evaluation of suspected primary hyperparathyroidism. Patient had originally been noted to have an elevated calcium level several years ago. She developed mild bone and joint pain. She has had frequent urination. She denies nephrolithiasis. She has had some problems with short-term memory issues. She complains of chronic fatigue.  Recent laboratory studies show an elevated serum calcium level ranging from 10.0-10.6. Intact PTH level was elevated ranging from 73-142.9. 25 hydroxy vitamin D level is normal at 35. 24-hour urine collection showed an elevated excretion of calcium 289 mg per day. Patient has not had any imaging studies. She is referred for further evaluation and recommendations.  Patient has no prior history of head or neck surgery. She has not had a bone density scan. There is no family history of parathyroid disease or other endocrine neoplasm. Nuclear medicine sestamibi scan localizes an adenoma to the left inferior position.  Past Medical History  Diagnosis Date  . Seasonal allergies   . History of palpitations     about 2 years ago-evaluated and everything cleared.  . H/O seasonal allergies     minimal  . History of ADHD     memory issues,focus- tx. Adderall  . Pain     Flank pain periodicall" family hx. polycystic kidney disease(father, brother)- don't know if she has.  . Headache(784.0)   . Neuromuscular disorder     " aching, tingling, numb feeling of body"  . Anemia     past hx. -resolved  . Rosacea     mild    Past Surgical History  Procedure Laterality Date  . Myomectomy  2007    Family History  Problem Relation Age of Onset  . Parkinsonism Mother   . Kidney disease Father   . Heart  disease Maternal Grandmother   . Cancer Maternal Grandfather     pancreatic  . Kidney disease Paternal Grandmother    Social History:  reports that she has never smoked. She has never used smokeless tobacco. She reports that she does not drink alcohol or use illicit drugs.  Allergies: No Known Allergies  Medications Prior to Admission  Medication Sig Dispense Refill  . amphetamine-dextroamphetamine (ADDERALL) 10 MG tablet Take 10 mg by mouth 2 (two) times daily as needed (Will take one daily then will take another if needed.).       Marland Kitchen Multiple Vitamin (MULTIVITAMIN WITH MINERALS) TABS tablet Take 1 tablet by mouth daily.      Marland Kitchen FINACEA 15 % cream Apply 1 application topically 2 (two) times daily.       Marland Kitchen ibuprofen (ADVIL,MOTRIN) 200 MG tablet Take 200-600 mg by mouth every 6 (six) hours as needed (Pain).        Results for orders placed during the hospital encounter of 10/05/13 (from the past 48 hour(s))  CBC     Status: Abnormal   Collection Time    10/05/13  2:20 PM      Result Value Ref Range   WBC 9.4  4.0 - 10.5 K/uL   RBC 4.63  3.87 - 5.11 MIL/uL   Hemoglobin 12.9  12.0 - 15.0 g/dL   HCT 39.6  36.0 - 46.0 %   MCV 85.5  78.0 - 100.0 fL   MCH 27.9  26.0 -  34.0 pg   MCHC 32.6  30.0 - 36.0 g/dL   RDW 13.5  11.5 - 15.5 %   Platelets 404 (*) 150 - 400 K/uL  HCG, SERUM, QUALITATIVE     Status: None   Collection Time    10/05/13  2:20 PM      Result Value Ref Range   Preg, Serum NEGATIVE  NEGATIVE   Comment:            THE SENSITIVITY OF THIS     METHODOLOGY IS >10 mIU/mL.   No results found.  Review of Systems  Constitutional: Positive for malaise/fatigue.  Eyes: Negative.   Respiratory: Negative.   Cardiovascular: Negative.   Gastrointestinal: Negative.   Genitourinary: Negative.   Musculoskeletal: Positive for joint pain.  Skin: Negative.   Neurological: Negative.   Endo/Heme/Allergies: Negative.   Psychiatric/Behavioral: Negative.     Blood pressure 131/82,  pulse 92, temperature 98 F (36.7 C), temperature source Oral, resp. rate 18, last menstrual period 08/29/2013, SpO2 100.00%. Physical Exam  Constitutional: She is oriented to person, place, and time. She appears well-developed and well-nourished. No distress.  HENT:  Head: Normocephalic and atraumatic.  Right Ear: External ear normal.  Left Ear: External ear normal.  Eyes: Conjunctivae are normal. Pupils are equal, round, and reactive to light. No scleral icterus.  Neck: Normal range of motion. Neck supple. No tracheal deviation present. No thyromegaly present.  Cardiovascular: Normal rate, regular rhythm and normal heart sounds.   No murmur heard. Respiratory: Effort normal and breath sounds normal.  GI: Soft. Bowel sounds are normal. She exhibits no distension.  Musculoskeletal: Normal range of motion. She exhibits no edema.  Lymphadenopathy:    She has no cervical adenopathy.  Neurological: She is alert and oriented to person, place, and time.  Skin: Skin is warm and dry.  Psychiatric: She has a normal mood and affect. Her behavior is normal.     Assessment/Plan Primary hyperparathyroidism, suspect left inferior gland  Plan minimally invasive parathyroidectomy  The risks and benefits of the procedure have been discussed at length with the patient.  The patient understands the proposed procedure, potential alternative treatments, and the course of recovery to be expected.  All of the patient's questions have been answered at this time.  The patient wishes to proceed with surgery.  Earnstine Regal, MD, [facility] Office: [employer] 10/07/2013, 11:23 AM

## 2013-10-07 NOTE — Anesthesia Preprocedure Evaluation (Signed)
Anesthesia Evaluation  Patient identified by MRN, date of birth, ID band Patient awake    Reviewed: Allergy & Precautions, H&P , NPO status , Patient's Chart, lab work & pertinent test results  Airway Mallampati: II TM Distance: >3 FB Neck ROM: Full    Dental  (+) Teeth Intact, Dental Advisory Given   Pulmonary neg pulmonary ROS,  breath sounds clear to auscultation  Pulmonary exam normal       Cardiovascular negative cardio ROS  Rhythm:Regular Rate:Normal     Neuro/Psych negative neurological ROS  negative psych ROS   GI/Hepatic negative GI ROS, Neg liver ROS, GERD-  ,  Endo/Other  Primary Hyperparathyroidism  Renal/GU negative Renal ROS  negative genitourinary   Musculoskeletal negative musculoskeletal ROS (+)   Abdominal   Peds  Hematology negative hematology ROS (+) anemia ,   Anesthesia Other Findings   Reproductive/Obstetrics negative OB ROS                           Anesthesia Physical Anesthesia Plan  ASA: II  Anesthesia Plan: General   Post-op Pain Management:    Induction: Intravenous  Airway Management Planned: Oral ETT  Additional Equipment:   Intra-op Plan:   Post-operative Plan: Extubation in OR  Informed Consent: I have reviewed the patients History and Physical, chart, labs and discussed the procedure including the risks, benefits and alternatives for the proposed anesthesia with the patient or authorized representative who has indicated his/her understanding and acceptance.   Dental advisory given  Plan Discussed with: CRNA  Anesthesia Plan Comments:         Anesthesia Quick Evaluation

## 2013-10-07 NOTE — Op Note (Signed)
NAME:  Amanda Fox, Amanda Fox NO.:  [id number]  MEDICAL RECORD NO.:  [id number]  LOCATION:  37                         FACILITY:  [facility]  PHYSICIAN:  Earnstine Regal, MD      DATE OF BIRTH:  Jun 27, 1970  DATE OF PROCEDURE:  10/07/2013                              OPERATIVE REPORT   PREOPERATIVE DIAGNOSIS:  Primary hyperparathyroidism.  POSTOPERATIVE DIAGNOSIS:  Primary hyperparathyroidism.  PROCEDURE: 1. Neck exploration. 2. Subtotal parathyroidectomy (3 glands). 3. Autotransplantation parathyroid tissue, left     sternocleidomastoid muscle.  SURGEON:  Armandina Gemma, MD, FACS  ANESTHESIA:  General per Dr. Freddie Apley.  ESTIMATED BLOOD LOSS:  Minimal.  PREPARATION:  ChloraPrep.  COMPLICATIONS:  None.  INDICATIONS:  The patient is a 43 year old female referred by her endocrinologist and her primary care physician for evaluation of suspected primary hyperparathyroidism.  The patient had been noted to have an elevated serum calcium level for several years.  She developed mild bone and joint pain.  She notes chronic fatigue.  Laboratory studies show calcium levels ranging from 10.0-10.6.  Intact PTH levels ranged from 73-142.9.  Vitamin D level was normal.  24 hour urine collection for calcium was elevated at 289 mg per day.  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  BODY OF REPORT:  Procedure was done in OR #1 at the [facility].  The patient was brought to the operating room, placed in supine position on the operating room table.  Following administration of general anesthesia, the patient was positioned and then prepped and draped in the usual strict aseptic fashion.  After ascertaining that an adequate level of anesthesia had been achieved, an anterior neck incision was made in the left inferior position. Dissection was carried through subcutaneous tissues.   Platysma was divided.  Skin flaps were elevated circumferentially and a Weitlaner retractor placed for exposure.  Strap muscles were incised in the midline and reflected to the left exposing the inferior pole of the left thyroid lobe.  Thyroid appears to be slightly nodular.  There were no dominant nor discrete masses.  There were no worrisome findings. Exploration was carried out extensively dissecting out the left lobe of the thyroid, skeletonizing the left inferior thyroid artery, identifying the recurrent laryngeal nerve on the left, and defining the esophagus on its left lateral border for several centimeters.  Dissection was carried back to the precervical fascia.  No evidence of enlarged parathyroid tissue was identified.  Thyrothymic tract was opened and dissected out, and there was no sign of ectopic parathyroid tissue.  Close examination of the capsule of the thyroid revealed what appears to be a small but normal appearing parathyroid gland near the inferior pole of the thyroid gland.  This was removed in its entirety.  It was sectioned and a biopsy was submitted to Pathology which confirmed parathyroid tissue which appears grossly normal.  The remainder of the gland was preserved in saline for autotransplantation.  Further exploration on the left revealed a normal parathyroid tissue arising and some adipose tissue on the precervical fascia near the posterior aspect of the superior pole of the  thyroid gland.  This was also excised in its entirety and inspected.  A biopsy was submitted to Pathology which confirmed normal parathyroid tissue.  The remainder of the tissue was preserved for autotransplantation.  At this point, the neck incision was extended to the right.  Formal subplatysmal flaps were developed cephalad and caudad and a [place] self-retaining retractor placed for exposure.  Strap muscles were incised cephalad and caudad in the midline allowing for good exposure  of the right thyroid lobe.  Right thyroid lobe was also somewhat nodular without discrete or dominant mass.  Exploration revealed no obvious superior parathyroid gland.  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  Finally, an abnormal appearing parathyroid gland was noted in the right inferior position. It was mobilized and its vascular pedicle divided between Ligaclips and it was excised.  It was submitted in its entirety to pathology and Dr. Claudette Laws who is reviewing the frozen sections for this case feels that this is a likely candidate for a parathyroid adenoma because it appears distinctly different from the 2 glands identified on the left side.  Decision was made to discontinue exploration at this point.  Neck was irrigated with warm saline and good hemostasis was achieved.  Fibrillar was placed throughout the operative field.  Strap muscles were reapproximated in the midline with interrupted 3-0 Vicryl sutures.  The remaining preserved parathyroid tissue from the 2 glands on the left was then morcellated with a #15 blade.  A pocket was created in the anterior aspect of the left sternocleidomastoid muscle.  The morcellated parathyroid tissue was inserted into this muscular cavity and the opening closed with a figure-of-eight 3-0 Vicryl suture.  A medium Ligaclip was placed on the knot of the suture to mark the location of the parathyroid tissue.  Platysma was then closed with interrupted 3-0 Vicryl sutures.  Skin was reapproximated with a running 4-0 Monocryl subcuticular suture.  Wound was washed and dried and benzoin and Steri-Strips were applied.  Sterile dressings were applied.  The patient was awakened from anesthesia and brought to the recovery room.  The patient tolerated the procedure well. The patient will be kept overnight for observation and monitoring  of serum calcium levels.   Earnstine Regal, MD, [facility] Office: [phone]   TMG/MEDQ  D:  10/07/2013  T:  10/07/2013  Job:  [id number]  cc:   Philemon Kingdom, M.D. Fax: [phone]  Annye Asa, M.D.

## 2013-10-07 NOTE — Transfer of Care (Deleted)
Immediate Anesthesia Transfer of Care Note  Patient: Amanda Fox  Procedure(s) Performed: Procedure(s) (LRB): NECK EXPLORATION SUBTOTAL PARATHYROIDECTOMY WITH AUTOTRASPLANTATION OF PARATHYRIOD TISSUE TO LEFT STERNOCLEIDOMASTOID MUSCLE (Left)  Patient Location: PACU  Anesthesia Type: General  Level of Consciousness: sedated, patient cooperative and responds to stimulation  Airway & Oxygen Therapy: Patient Spontanous Breathing and Patient connected to face mask oxgen  Post-op Assessment: Report given to PACU RN and Post -op Vital signs reviewed and stable  Post vital signs: Reviewed and stable  Complications: No apparent anesthesia complications

## 2013-10-08 DIAGNOSIS — E210 Primary hyperparathyroidism: Secondary | ICD-10-CM | POA: Diagnosis not present

## 2013-10-08 LAB — BASIC METABOLIC PANEL WITH GFR
Anion gap: 14 (ref 5–15)
BUN: 6 mg/dL (ref 6–23)
CO2: 23 meq/L (ref 19–32)
Calcium: 11.5 mg/dL — ABNORMAL HIGH (ref 8.4–10.5)
Chloride: 100 meq/L (ref 96–112)
Creatinine, Ser: 0.57 mg/dL (ref 0.50–1.10)
GFR calc Af Amer: >90 mL/min
GFR calc non Af Amer: >90 mL/min
Glucose, Bld: 118 mg/dL — ABNORMAL HIGH (ref 70–99)
Potassium: 4.1 meq/L (ref 3.7–5.3)
Sodium: 137 meq/L (ref 137–147)

## 2013-10-08 MED ORDER — SODIUM CHLORIDE 0.9 % IJ SOLN: 3.0000 mL | INTRAMUSCULAR | Status: DC | PRN

## 2013-10-08 MED ORDER — SODIUM CHLORIDE 0.9 % IJ SOLN: 3.0000 mL | Freq: Two times a day (BID) | INTRAMUSCULAR | Status: DC

## 2013-10-08 MED ORDER — LACTATED RINGERS IV BOLUS (SEPSIS): 1000.0000 mL | Freq: Three times a day (TID) | INTRAVENOUS | Status: DC | PRN

## 2013-10-08 MED ORDER — HYDROCODONE-ACETAMINOPHEN 5-325 MG PO TABS: 1.0000 | ORAL_TABLET | ORAL | Status: DC | PRN

## 2013-10-08 NOTE — Discharge Instructions (Signed)
Do not take any calcium by mouth

## 2013-10-08 NOTE — Discharge Summary (Signed)
Physician Discharge Summary  Patient ID: ALEJANDRO ADCOX MRN: [id number] DOB/AGE: 11/14/1970 43 y.o.  Admit date: 10/07/2013 Discharge date: 10/08/2013  Admission Diagnoses:  Hypercalcemia; hyperparathyroidism  Discharge Diagnoses:  same  Principal Problem:   Hyperparathyroidism Active Problems:   Primary hyperparathyroidism   Surgery:  Neck exploration and pararthyroid biopsy with autotransplantation  Discharged Condition: stable  Hospital Course:   Had neck exploration.  Surgically stable.  Calcium levels postop in the 10-11 range.  Ready for discharge. Followup with Dr. Harlow Asa next week after repeat calcium level on Monday.   Consults: none  Significant Diagnostic Studies: repeat calcium levels remain elevated    Discharge Exam: Blood pressure 126/82, pulse 65, temperature 98.5 F (36.9 C), temperature source Oral, resp. rate 18, height 5\' 4"  (1.626 m), weight 157 lb (71.215 kg), last menstrual period 08/29/2013, SpO2 99.00%. Minimal pain in incision.  Using ice packs as directed  Disposition:   Discharge Instructions   Diet - low sodium heart healthy    Complete by:  As directed      Discharge instructions    Complete by:  As directed   Ice pack for comfort Go to lab on Monday and have serum calcium drawn     Increase activity slowly    Complete by:  As directed      No dressing needed    Complete by:  As directed             Medication List         amphetamine-dextroamphetamine 10 MG tablet  Commonly known as:  ADDERALL  Take 10 mg by mouth 2 (two) times daily as needed (Will take one daily then will take another if needed.).     FINACEA 15 % cream  Generic drug:  Azelaic Acid  Apply 1 application topically 2 (two) times daily.     HYDROcodone-acetaminophen 5-325 MG per tablet  Commonly known as:  NORCO/VICODIN  Take 1 tablet by mouth every 4 (four) hours as needed for moderate pain.     ibuprofen 200 MG tablet  Commonly known as:  ADVIL,MOTRIN   Take 200-600 mg by mouth every 6 (six) hours as needed (Pain).     multivitamin with minerals Tabs tablet  Take 1 tablet by mouth daily.           Follow-up Information   Follow up with Earnstine Regal, MD. Schedule an appointment as soon as possible for a visit in 3 days.   Specialty:  General Surgery   Contact information:   [address] [place] [address] [phone]       Signed: Pedro Earls 10/08/2013, 11:06 AM

## 2013-10-10 ENCOUNTER — Other Ambulatory Visit (INDEPENDENT_AMBULATORY_CARE_PROVIDER_SITE_OTHER): Payer: Self-pay | Admitting: Surgery

## 2013-10-10 ENCOUNTER — Encounter (HOSPITAL_COMMUNITY): Payer: Self-pay | Admitting: Surgery

## 2013-10-10 LAB — CALCIUM: CALCIUM: 10.9 mg/dL — AB (ref 8.7–10.2)

## 2013-10-11 ENCOUNTER — Other Ambulatory Visit (INDEPENDENT_AMBULATORY_CARE_PROVIDER_SITE_OTHER): Payer: Self-pay

## 2013-10-11 ENCOUNTER — Ambulatory Visit (INDEPENDENT_AMBULATORY_CARE_PROVIDER_SITE_OTHER): Payer: 59 | Admitting: Surgery

## 2013-10-11 ENCOUNTER — Encounter (INDEPENDENT_AMBULATORY_CARE_PROVIDER_SITE_OTHER): Payer: Self-pay | Admitting: Surgery

## 2013-10-11 VITALS — BP 116/82 | HR 71 | Temp 97.3°F | Resp 16 | Ht 64.0 in | Wt 154.2 lb

## 2013-10-11 DIAGNOSIS — E041 Nontoxic single thyroid nodule: Secondary | ICD-10-CM

## 2013-10-11 DIAGNOSIS — E213 Hyperparathyroidism, unspecified: Secondary | ICD-10-CM

## 2013-10-11 NOTE — Progress Notes (Signed)
General Surgery [facility]  Chief Complaint  Patient presents with  . Routine Post Op    Neck exploration with parathyroidectomy 10/07/2013    HISTORY: Patient is a 43 year old female who underwent neck exploration for parathyroidectomy on 10/07/2013. Sestamibi scan had localized a parathyroid adenoma to the left inferior position. Intraoperatively, a normal parathyroid gland was identified at that position. Likewise a normal parathyroid gland was found that the left superior position. In the course of dissection both of these glands were devascularized. Biopsies were taken and confirmed normal parathyroid tissue. Both glands were reimplanted in the left sternocleidomastoid muscle.  Further dissection revealed abnormal tissue in the right thyrothymic tract. This was resected and proved to be thyroid tissue which was benign. Further exploration revealed an enlarged 1.4 cm parathyroid gland in the right inferior position. This was resected. However on final pathology this was felt to represent benign normal parathyroid tissue. Exploration of the right superior region failed to reveal any parathyroid tissue.  Postoperatively the patient's serum calcium level remained elevated. She has had no signs or symptoms of hypocalcemia. She returns today for a wound check.  EXAM: Surgical incision is healing nicely. Steri-Strips remain in place. Mild soft tissue swelling. No sign of infection. Voice quality is normal.  IMPRESSION: Status post neck exploration and autotransplantation of parathyroid tissue  PLAN: The patient and I reviewed her pathology report and discussed the intraoperative decision making at length.  I have recommended laboratory work in 4 weeks. We will obtain an ultrasound of the neck to evaluate both the thyroid gland and hopefully identify a parathyroid adenoma in 6 weeks. Patient will return after the study for physical exam and review of her results.  Patient  expresses understanding regarding the surgery and the surgical decision-making. She will return in 6 weeks for further evaluation and recommendations.  Earnstine Regal, MD, [facility]   Visit Diagnoses: 1. Hyperparathyroidism   2. Thyroid nodule

## 2013-10-11 NOTE — Addendum Note (Signed)
Addendum created 10/11/13 1541 by Tiajuana Amass, MD   Modules edited: Anesthesia Attestations

## 2013-10-11 NOTE — Patient Instructions (Signed)
  CARE OF INCISION   Apply cocoa butter/vitamin E cream (Palmer's brand) to your incision 2 - 3 times daily.  Massage cream into incision for one minute with each application.  Use sunscreen (50 SPF or higher) for first 6 months after surgery if area is exposed to sun.  You may alternate Mederma or other scar reducing cream with cocoa butter cream if desired.       Dona Walby M. Sarim Rothman, MD, FACS      [facility]      Office: [phone]    

## 2013-10-12 ENCOUNTER — Telehealth (INDEPENDENT_AMBULATORY_CARE_PROVIDER_SITE_OTHER): Payer: Self-pay | Admitting: *Deleted

## 2013-10-12 NOTE — Telephone Encounter (Signed)
LMOM for pt to return my call regarding her [place] of Head/Neck.  Please advise pt it has been scheduled for 11-22-13 arriving at [facility] at 11:15 a.m.  No prep needed.  Thanks!  Anderson Malta

## 2013-10-13 ENCOUNTER — Other Ambulatory Visit: Payer: Self-pay | Admitting: Family Medicine

## 2013-10-13 MED ORDER — AMPHETAMINE-DEXTROAMPHETAMINE 10 MG PO TABS: 10.0000 mg | ORAL_TABLET | Freq: Two times a day (BID) | ORAL | Status: DC | PRN

## 2013-11-01 ENCOUNTER — Encounter (INDEPENDENT_AMBULATORY_CARE_PROVIDER_SITE_OTHER): Payer: 59 | Admitting: Surgery

## 2013-11-11 ENCOUNTER — Ambulatory Visit: Payer: 59 | Admitting: Internal Medicine

## 2013-11-22 ENCOUNTER — Other Ambulatory Visit: Payer: Self-pay | Admitting: Family Medicine

## 2013-11-22 ENCOUNTER — Other Ambulatory Visit: Payer: 59

## 2013-11-22 MED ORDER — AMPHETAMINE-DEXTROAMPHETAMINE 10 MG PO TABS: 10.0000 mg | ORAL_TABLET | Freq: Two times a day (BID) | ORAL | Status: DC | PRN

## 2013-11-22 NOTE — Telephone Encounter (Signed)
Last OV 07-15-13 Med filled 10-13-13 #60 with 0

## 2013-11-22 NOTE — Telephone Encounter (Signed)
Med filled and pt notified.  

## 2013-11-25 ENCOUNTER — Ambulatory Visit
Admission: RE | Admit: 2013-11-25 | Discharge: 2013-11-25 | Disposition: A | Discharge status: Home / Self Care | Payer: 59 | Source: Ambulatory Visit | Attending: Surgery | Admitting: Surgery

## 2013-11-25 DIAGNOSIS — E041 Nontoxic single thyroid nodule: Secondary | ICD-10-CM

## 2013-12-01 LAB — PTH, INTACT AND CALCIUM
Calcium: 10.5 mg/dL — ABNORMAL HIGH (ref 8.7–10.2)
PTH: 65 pg/mL (ref 15–65)

## 2013-12-06 ENCOUNTER — Encounter: Payer: Self-pay | Admitting: Family Medicine

## 2013-12-06 ENCOUNTER — Other Ambulatory Visit (INDEPENDENT_AMBULATORY_CARE_PROVIDER_SITE_OTHER): Payer: Self-pay

## 2013-12-06 ENCOUNTER — Encounter (INDEPENDENT_AMBULATORY_CARE_PROVIDER_SITE_OTHER): Payer: 59 | Admitting: Surgery

## 2013-12-06 DIAGNOSIS — E213 Hyperparathyroidism, unspecified: Secondary | ICD-10-CM

## 2013-12-06 DIAGNOSIS — E210 Primary hyperparathyroidism: Secondary | ICD-10-CM

## 2013-12-21 ENCOUNTER — Other Ambulatory Visit: Payer: Self-pay | Admitting: Family Medicine

## 2013-12-21 MED ORDER — AMPHETAMINE-DEXTROAMPHETAMINE 10 MG PO TABS: 10.0000 mg | ORAL_TABLET | Freq: Two times a day (BID) | ORAL | Status: DC | PRN

## 2013-12-21 NOTE — Telephone Encounter (Signed)
Pt.notified

## 2013-12-21 NOTE — Telephone Encounter (Signed)
Last OV 07-15-13 adderall filled 11-22-13 #60 with 0

## 2014-03-24 ENCOUNTER — Ambulatory Visit (INDEPENDENT_AMBULATORY_CARE_PROVIDER_SITE_OTHER): Payer: 59 | Admitting: Family Medicine

## 2014-03-24 ENCOUNTER — Encounter: Payer: Self-pay | Admitting: Family Medicine

## 2014-03-24 VITALS — BP 122/82 | HR 105 | Temp 98.6°F | Resp 16 | Wt 163.2 lb

## 2014-03-24 DIAGNOSIS — F900 Attention-deficit hyperactivity disorder, predominantly inattentive type: Secondary | ICD-10-CM

## 2014-03-24 DIAGNOSIS — N390 Urinary tract infection, site not specified: Secondary | ICD-10-CM

## 2014-03-24 MED ORDER — LISDEXAMFETAMINE DIMESYLATE 30 MG PO CAPS: 30.0000 mg | ORAL_CAPSULE | Freq: Every day | ORAL | Status: DC

## 2014-03-24 MED ORDER — TRIMETHOPRIM 100 MG PO TABS: 100.0000 mg | ORAL_TABLET | Freq: Every day | ORAL | Status: DC

## 2014-03-24 NOTE — Progress Notes (Signed)
   Subjective:    Patient ID: Amanda Fox, female    DOB: [birth year], 44 y.o.   MRN: [id number]  HPI ADD- chronic problem, currently on Adderall but not taking regularly b/c she had 'super terrible HAs'.  Pt also had issues w/ elevated BP and HR.  Pt was having difficulty distinguishing ADD sxs from parathyroid issues.  Pt reports memory is 50% better.  Concentration and focus are ~30% better.  Pt still having difficulty pulling together information.  Pt is interested in changing to Vyvanse.  Recurrent UTIs- pt sees Dr Helane Rima for GYN and has previously been given meds for prophylactic use (Macrobid but pt had nausea and HAs).  Pt gets UTIs w/ intercourse.   Review of Systems For ROS see HPI     Objective:   Physical Exam  Constitutional: She is oriented to person, place, and time. She appears well-developed and well-nourished. No distress.  HENT:  Head: Normocephalic and atraumatic.  Neck: Normal range of motion. Neck supple. No thyromegaly present.  Cardiovascular: Normal rate, regular rhythm, normal heart sounds and intact distal pulses.   Pulmonary/Chest: Effort normal and breath sounds normal. No respiratory distress. She has no wheezes. She has no rales.  Lymphadenopathy:    She has no cervical adenopathy.  Neurological: She is alert and oriented to person, place, and time.  Skin: Skin is warm and dry.  Psychiatric: She has a normal mood and affect. Her behavior is normal. Thought content normal.  Vitals reviewed.         Assessment & Plan:

## 2014-03-24 NOTE — Patient Instructions (Signed)
Follow up in 4-6 weeks to recheck focus, BP, HR Start the Vyvanse daily Use the Trimethoprim as needed Drink plenty of fluids Call with any questions or concerns Happy New Year!

## 2014-03-24 NOTE — Progress Notes (Signed)
Pre visit review using our clinic review tool, if applicable. No additional management support is needed unless otherwise documented below in the visit note. 

## 2014-03-27 NOTE — Assessment & Plan Note (Signed)
New.  Pt typically has UTIs after intercourse.  Having side effects from previous Macrobid script.  Will switch to trimethoprim.  Pt expressed understanding and is in agreement w/ plan.

## 2014-03-27 NOTE — Assessment & Plan Note (Signed)
Ongoing issue for pt.  Having some side effects from Adderall.  Will attempt to switch to Vyvanse and monitor for improvement.

## 2014-04-05 ENCOUNTER — Telehealth: Payer: Self-pay | Admitting: *Deleted

## 2014-04-05 NOTE — Telephone Encounter (Signed)
Prior authorization for Vyvanse initiated. Awaiting determination. JG//CMA  

## 2014-04-11 NOTE — Telephone Encounter (Signed)
PA approved.

## 2014-04-14 ENCOUNTER — Ambulatory Visit (INDEPENDENT_AMBULATORY_CARE_PROVIDER_SITE_OTHER): Payer: 59 | Admitting: Family Medicine

## 2014-04-14 ENCOUNTER — Encounter: Payer: Self-pay | Admitting: Family Medicine

## 2014-04-14 VITALS — BP 124/80 | HR 100 | Temp 98.0°F | Resp 16 | Wt 162.5 lb

## 2014-04-14 DIAGNOSIS — F900 Attention-deficit hyperactivity disorder, predominantly inattentive type: Secondary | ICD-10-CM

## 2014-04-14 MED ORDER — METHYLPHENIDATE HCL ER 18 MG PO TB24: 18.0000 mg | ORAL_TABLET | Freq: Every day | ORAL | Status: DC

## 2014-04-14 NOTE — Assessment & Plan Note (Signed)
Pt cannot tolerate the cumulative effect of Vyvanse by the end of the week.  Had numbness/tingling/palpitations w/ adderall.  Did not have any improvement in her ADD sxs on Strattera.  Will start low dose Concerta and monitor for side effects and symptom improvement.  Pt expressed understanding and is in agreement w/ plan.

## 2014-04-14 NOTE — Progress Notes (Signed)
Pre visit review using our clinic review tool, if applicable. No additional management support is needed unless otherwise documented below in the visit note. 

## 2014-04-14 NOTE — Progress Notes (Signed)
   Subjective:    Patient ID: Amanda Fox, female    DOB: 1971-02-07, 44 y.o.   MRN: [id number]  HPI ADD- pt was switched to Vyvanse at last visit.  Pt reports that by the end of the week 'I feel sedated'.  'Monday, Tuesday- it was perfect.  By Wednesday I was starting to feel tired.  Thursday was worse and Friday was borderline comatose'.  Pt has previously tried Oncologist.   Review of Systems For ROS see HPI     Objective:   Physical Exam  Constitutional: She is oriented to person, place, and time. She appears well-developed and well-nourished. No distress.  HENT:  Head: Normocephalic and atraumatic.  Cardiovascular: Normal rate, regular rhythm and normal heart sounds.   Pulmonary/Chest: Effort normal and breath sounds normal. No respiratory distress. She has no wheezes. She has no rales.  Neurological: She is alert and oriented to person, place, and time. No cranial nerve deficit. Coordination normal.  Skin: Skin is warm and dry.  Psychiatric: She has a normal mood and affect. Her behavior is normal. Thought content normal.  Vitals reviewed.         Assessment & Plan:

## 2014-04-14 NOTE — Patient Instructions (Signed)
Follow up in 4 weeks to recheck ADD STOP the Vyvanse Start the Concerta daily Call with any questions or concerns Happy Valentine's Day

## 2014-04-17 ENCOUNTER — Encounter: Payer: Self-pay | Admitting: Family Medicine

## 2014-04-21 ENCOUNTER — Ambulatory Visit: Payer: 59 | Admitting: Family Medicine

## 2014-05-01 ENCOUNTER — Encounter: Payer: Self-pay | Admitting: Family Medicine

## 2014-05-02 NOTE — Telephone Encounter (Signed)
PA for Concerta initiated. Awaiting determination. JG//CMA

## 2014-05-12 ENCOUNTER — Ambulatory Visit (INDEPENDENT_AMBULATORY_CARE_PROVIDER_SITE_OTHER): Payer: 59 | Admitting: Family Medicine

## 2014-05-12 ENCOUNTER — Encounter: Payer: Self-pay | Admitting: Family Medicine

## 2014-05-12 VITALS — BP 122/80 | HR 94 | Temp 98.2°F | Resp 16 | Wt 162.2 lb

## 2014-05-12 DIAGNOSIS — F900 Attention-deficit hyperactivity disorder, predominantly inattentive type: Secondary | ICD-10-CM

## 2014-05-12 MED ORDER — METHYLPHENIDATE HCL ER (OSM) 27 MG PO TBCR: 27.0000 mg | EXTENDED_RELEASE_TABLET | ORAL | Status: DC

## 2014-05-12 NOTE — Progress Notes (Signed)
Pre visit review using our clinic review tool, if applicable. No additional management support is needed unless otherwise documented below in the visit note. 

## 2014-05-12 NOTE — Patient Instructions (Signed)
Follow up by MyChart or phone in 2-3 weeks Start the increased dose of 27mg  daily Call with any questions or concerns Happy Spring!!

## 2014-05-12 NOTE — Assessment & Plan Note (Signed)
This is the 1st medication that pt has tried and not had side effects.  Unfortunately, she did not have symptom improvement on the medication but maybe it is b/c the dose is too low.  Based on this, we will increase the dose and monitor for symptom improvement.  Pt expressed understanding and is in agreement w/ plan.

## 2014-05-12 NOTE — Progress Notes (Signed)
   Subjective:    Patient ID: Amanda Fox, female    DOB: 10/24/1970, 44 y.o.   MRN: [id number]  HPI ADD- chronic problem, on Concerta 18.  No side effects from starting new med but not achieving symptom control.  Denies palpitations, N/V, insomnia, anorexia.   Review of Systems For ROS see HPI     Objective:   Physical Exam  Constitutional: She is oriented to person, place, and time. She appears well-developed and well-nourished. No distress.  HENT:  Head: Normocephalic and atraumatic.  Neurological: She is alert and oriented to person, place, and time.  Skin: Skin is warm and dry.  Psychiatric: She has a normal mood and affect. Her behavior is normal. Thought content normal.  Vitals reviewed.         Assessment & Plan:

## 2014-05-25 ENCOUNTER — Encounter: Payer: Self-pay | Admitting: Family Medicine

## 2014-05-26 MED ORDER — CONCERTA 36 MG PO TBCR
36.0000 mg | EXTENDED_RELEASE_TABLET | Freq: Every day | ORAL | Status: DC
Start: 2014-05-26 — End: 2014-06-16

## 2014-06-07 ENCOUNTER — Encounter: Payer: Self-pay | Admitting: Family Medicine

## 2014-06-16 MED ORDER — METHYLPHENIDATE HCL ER (OSM) 54 MG PO TBCR: 54.0000 mg | EXTENDED_RELEASE_TABLET | ORAL | Status: DC

## 2014-06-16 NOTE — Addendum Note (Signed)
Addended by: Midge Minium on: 06/16/2014 08:30 AM   Modules accepted: Orders

## 2014-07-07 MED ORDER — LISDEXAMFETAMINE DIMESYLATE 30 MG PO CAPS: 30.0000 mg | ORAL_CAPSULE | Freq: Every day | ORAL | Status: DC

## 2014-07-07 NOTE — Addendum Note (Signed)
Addended by: Kris Hartmann on: 07/07/2014 09:58 AM   Modules accepted: Orders

## 2014-07-07 NOTE — Telephone Encounter (Signed)
Med filled.  

## 2014-09-22 IMAGING — NM NM PARATHYROID W/ SPECT
3 series · 18 of 18 positions shown · non-contrast
Comparison: None.

CLINICAL DATA: Elevated parathyroid hormone

EXAM:
NM PARATHYROID SCINTIGRAPHY AND SPECT IMAGING
TECHNIQUE: Following intravenous administration of radiopharmaceutical, early
and 2-hour delayed planar images were obtained in the anterior
projection. Delayed triplanar SPECT images were also obtained at 2
hours.
RADIOPHARMACEUTICALS:  25 mCi [id number] Sestamibi IV

[Series 1: spect - (id)_(id)_cor · 8.3mm · 8.28mm/px · 6 of 64 frames shown]
[frame 6/64]
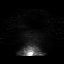
[frame 16/64]
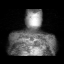
[frame 27/64]
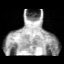
[frame 38/64]
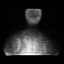
[frame 48/64]
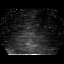
[frame 59/64]
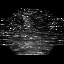

[Series 1: spect - (id)_(id)_tra · 8.3mm · 8.28mm/px · 6 of 64 frames shown]
[frame 6/64]
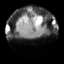
[frame 16/64]
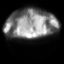
[frame 27/64]
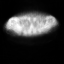
[frame 38/64]
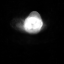
[frame 48/64]
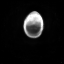
[frame 59/64]
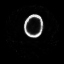

[Series 4: spect parathyroid · 8.28mm/px · 6 of 64 frames shown]
[frame 6/64]
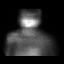
[frame 16/64]
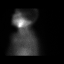
[frame 27/64]
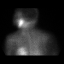
[frame 38/64]
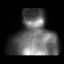
[frame 48/64]
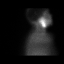
[frame 59/64]
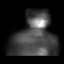

[18 of 18 positions shown; findings below may reference images not displayed]

FINDINGS: On the early images there is increased radiotracer uptake in both
lobes of the thyroid gland. There is also a medium size focus of
intense radiotracer uptake in the region of the inferior pole of the
left lobe of thyroid gland. On the washout images there is decreased
radiotracer activity within both lobes of the thyroid gland with a
persistent focus of intense radiotracer uptake localizing to the
inferior pole the left lobe of the thyroid gland.
IMPRESSION: 1. Parathyroid adenoma localizes to the area of the inferior pole of
left lobe of thyroid gland.

## 2014-10-13 ENCOUNTER — Encounter: Payer: Self-pay | Admitting: Family Medicine

## 2014-10-13 DIAGNOSIS — F900 Attention-deficit hyperactivity disorder, predominantly inattentive type: Secondary | ICD-10-CM

## 2014-10-13 DIAGNOSIS — Z1283 Encounter for screening for malignant neoplasm of skin: Secondary | ICD-10-CM

## 2014-10-13 DIAGNOSIS — Z0100 Encounter for examination of eyes and vision without abnormal findings: Secondary | ICD-10-CM

## 2014-10-13 NOTE — Telephone Encounter (Signed)
Referrals placed 

## 2015-08-24 ENCOUNTER — Encounter: Payer: Self-pay | Admitting: Behavioral Health

## 2015-08-24 ENCOUNTER — Telehealth: Payer: Self-pay | Admitting: Behavioral Health

## 2015-08-24 NOTE — Telephone Encounter (Signed)
Pre-Visit Call completed with patient and chart updated.   Pre-Visit Info documented in Specialty Comments under [employer].    

## 2015-08-27 ENCOUNTER — Ambulatory Visit (INDEPENDENT_AMBULATORY_CARE_PROVIDER_SITE_OTHER): Payer: BLUE CROSS/BLUE SHIELD | Admitting: Obstetrics & Gynecology

## 2015-08-27 ENCOUNTER — Ambulatory Visit (INDEPENDENT_AMBULATORY_CARE_PROVIDER_SITE_OTHER): Payer: BLUE CROSS/BLUE SHIELD | Admitting: Family Medicine

## 2015-08-27 ENCOUNTER — Encounter: Payer: Self-pay | Admitting: Obstetrics & Gynecology

## 2015-08-27 VITALS — BP 136/81 | HR 79 | Temp 98.6°F | Ht 64.0 in | Wt 159.6 lb

## 2015-08-27 VITALS — BP 139/82 | HR 87 | Ht 64.0 in

## 2015-08-27 DIAGNOSIS — Z124 Encounter for screening for malignant neoplasm of cervix: Secondary | ICD-10-CM

## 2015-08-27 DIAGNOSIS — Z0000 Encounter for general adult medical examination without abnormal findings: Secondary | ICD-10-CM | POA: Diagnosis not present

## 2015-08-27 DIAGNOSIS — E663 Overweight: Secondary | ICD-10-CM

## 2015-08-27 DIAGNOSIS — Z01419 Encounter for gynecological examination (general) (routine) without abnormal findings: Secondary | ICD-10-CM | POA: Diagnosis not present

## 2015-08-27 DIAGNOSIS — Z130 Encounter for screening for diseases of the blood and blood-forming organs and certain disorders involving the immune mechanism: Secondary | ICD-10-CM | POA: Diagnosis not present

## 2015-08-27 DIAGNOSIS — Z13220 Encounter for screening for lipoid disorders: Secondary | ICD-10-CM | POA: Diagnosis not present

## 2015-08-27 DIAGNOSIS — Z131 Encounter for screening for diabetes mellitus: Secondary | ICD-10-CM | POA: Diagnosis not present

## 2015-08-27 DIAGNOSIS — N390 Urinary tract infection, site not specified: Secondary | ICD-10-CM

## 2015-08-27 DIAGNOSIS — Z1151 Encounter for screening for human papillomavirus (HPV): Secondary | ICD-10-CM

## 2015-08-27 DIAGNOSIS — Z87410 Personal history of cervical dysplasia: Secondary | ICD-10-CM | POA: Diagnosis not present

## 2015-08-27 DIAGNOSIS — Z9009 Acquired absence of other part of head and neck: Secondary | ICD-10-CM

## 2015-08-27 DIAGNOSIS — E892 Postprocedural hypoparathyroidism: Secondary | ICD-10-CM

## 2015-08-27 LAB — COMPREHENSIVE METABOLIC PANEL
ALT: 17 U/L (ref 0–35)
AST: 19 U/L (ref 0–37)
Albumin: 3.9 g/dL (ref 3.5–5.2)
Alkaline Phosphatase: 53 U/L (ref 39–117)
BILIRUBIN TOTAL: 0.3 mg/dL (ref 0.2–1.2)
BUN: 7 mg/dL (ref 6–23)
CO2: 23 mEq/L (ref 19–32)
CREATININE: 0.69 mg/dL (ref 0.40–1.20)
Calcium: 8.5 mg/dL (ref 8.4–10.5)
Chloride: 104 mEq/L (ref 96–112)
GFR: 97.93 mL/min (ref >60.00)
GLUCOSE: 89 mg/dL (ref 70–99)
Potassium: 3.8 mEq/L (ref 3.5–5.1)
Sodium: 137 mEq/L (ref 135–145)
TOTAL PROTEIN: 7 g/dL (ref 6.0–8.3)

## 2015-08-27 LAB — HEMOGLOBIN A1C: Hgb A1c MFr Bld: 5.6 % (ref 4.6–6.5)

## 2015-08-27 LAB — TSH: TSH: 1.56 u[IU]/mL (ref 0.35–4.50)

## 2015-08-27 LAB — CBC
HCT: 36.2 % (ref 36.0–46.0)
Hemoglobin: 11.8 g/dL — ABNORMAL LOW (ref 12.0–15.0)
MCHC: 32.7 g/dL (ref 30.0–36.0)
MCV: 79 fl (ref 78.0–100.0)
Platelets: 425 10*3/uL — ABNORMAL HIGH (ref 150.0–400.0)
RBC: 4.59 Mil/uL (ref 3.87–5.11)
RDW: 15.2 % (ref 11.5–15.5)
WBC: 6.4 10*3/uL (ref 4.0–10.5)

## 2015-08-27 LAB — LIPID PANEL
Cholesterol: 131 mg/dL (ref 0–200)
HDL: 42.7 mg/dL (ref >39.00)
LDL Cholesterol: 71 mg/dL (ref 0–99)
NONHDL: 87.92
TRIGLYCERIDES: 86 mg/dL (ref 0.0–149.0)
Total CHOL/HDL Ratio: 3
VLDL: 17.2 mg/dL (ref 0.0–40.0)

## 2015-08-27 MED ORDER — TRIMETHOPRIM 100 MG PO TABS: 100.0000 mg | ORAL_TABLET | Freq: Every day | ORAL | Status: DC

## 2015-08-27 NOTE — Progress Notes (Signed)
Pre visit review using our clinic review tool, if applicable. No additional management support is needed unless otherwise documented below in the visit note. 

## 2015-08-27 NOTE — Patient Instructions (Signed)
It was great to see you today- I will be in touch with your labs asap We will check on your calcium,thyroid and parathyroid levels today

## 2015-08-27 NOTE — Progress Notes (Signed)
[place] [facility] [address], [place], [place] [address] [phone]  Date:  08/27/2015   Name:  Amanda Fox   DOB:  May 16, 1970   MRN:  [id number]  PCP:  Annye Asa, MD    Chief Complaint: Establish Care   History of Present Illness:  Amanda Fox is a 45 y.o. very pleasant female patient who presents with the following:  Here today to establish care.  I am taking over her care since her last PCP moved to [place].   She would like to do a CPE today if possible  She did have a parathyroidectomy a couple of years ago. This was discovered due to her elevated calcium level She is not on thyroid replacement at this time.  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    Her vyvanse is managed by Dr. Johnnye Sima. This does help her but she still feels a bit scattered even with the medication  She is a little overdue on her mammogram but will update this year She is married, works as a Social worker. They do not have children  Patient Active Problem List   Diagnosis Date Noted  . Recurrent UTI 03/24/2014  . Primary hyperparathyroidism ([place]) 10/07/2013  . Hyperparathyroidism ([place]) 07/12/2013  . ADD (attention deficit hyperactivity disorder, inattentive type) 06/13/2013  . Tachycardia 06/13/2013  . Positive ANA (antinuclear antibody) 06/13/2013  . Routine general medical examination at a health care facility 06/13/2013  . Viral URI with cough 01/14/2013  . Nerve pain 07/23/2012  . Nocturnal leg movements 07/23/2012  . Trapezius muscle spasm 07/23/2012  . SNORING 07/24/2009  . ANEMIA-NOS 07/06/2009  . GERD 07/06/2009  . HAIR LOSS 07/06/2009  . FATIGUE 07/06/2009  . WEIGHT GAIN 07/06/2009    Past Medical History  Diagnosis Date  . Seasonal allergies   . History of  palpitations     about 2 years ago-evaluated and everything cleared.  . H/O seasonal allergies     minimal  . History of ADHD     memory issues,focus- tx. Adderall  . Pain     Flank pain periodicall" family hx. polycystic kidney disease(father, brother)- don't know if she has.  . Headache(784.0)   . Neuromuscular disorder ([place])     " aching, tingling, numb feeling of body"  . Anemia     past hx. -resolved  . Rosacea     mild    Past Surgical History  Procedure Laterality Date  . Myomectomy  2007  . Parathyroidectomy Left 10/07/2013    Procedure: NECK EXPLORATION SUBTOTAL PARATHYROIDECTOMY WITH AUTOTRASPLANTATION OF PARATHYRIOD TISSUE TO LEFT STERNOCLEIDOMASTOID MUSCLE;  Surgeon: Earnstine Regal, MD;  Location: [facility];  Service: General;  Laterality: Left;    Social History  Substance Use Topics  . Smoking status: Never Smoker   . Smokeless tobacco: Never Used  . Alcohol Use: No     Comment: rare    Family History  Problem Relation Age of Onset  . Parkinsonism Mother   . Kidney disease Father   . Heart disease Maternal Grandmother   . Cancer Maternal Grandfather     pancreatic  . Kidney disease Paternal Grandmother     No Known Allergies  Medication list has been reviewed and updated.  Current  Outpatient Prescriptions on File Prior to Visit  Medication Sig Dispense Refill  . acetaminophen (TYLENOL) 500 MG tablet Take 500 mg by mouth as needed. Reported on 08/24/2015    . ibuprofen (ADVIL,MOTRIN) 200 MG tablet Take 200-600 mg by mouth every 6 (six) hours as needed (Pain).    Marland Kitchen lisdexamfetamine (VYVANSE) 30 MG capsule Take 1 capsule (30 mg total) by mouth daily. (Patient taking differently: Take 60 mg by mouth daily. ) 30 capsule 0  . methylphenidate 54 MG PO CR tablet Take 1 tablet (54 mg total) by mouth every morning. 30 tablet 0  . Multiple Vitamin (MULTIVITAMIN WITH MINERALS) TABS tablet Take 1 tablet by mouth daily.    Marland Kitchen trimethoprim (TRIMPEX) 100 MG tablet Take 1  tablet (100 mg total) by mouth daily. Take as soon as possible after intercourse (before if possible) and then next day 30 tablet 3   No current facility-administered medications on file prior to visit.    Review of Systems:  As per HPI- otherwise negative.   Physical Examination: Filed Vitals:   08/27/15 1057  BP: 136/81  Pulse: 79  Temp: 98.6 F (37 C)   Filed Vitals:   08/27/15 1057  Height: 5\' 4"  (1.626 m)  Weight: 159 lb 9.6 oz (72.394 kg)   Body mass index is 27.38 kg/(m^2). Ideal Body Weight: Weight in (lb) to have BMI = 25: 145.3  GEN: WDWN, NAD, Non-toxic, A & O x 3, overweight, looks well HEENT: Atraumatic, Normocephalic. Neck supple. No masses, No LAD.  Bilateral TM wnl, oropharynx normal.  PEERL,EOMI.   Ears and Nose: No external deformity. CV: RRR, No M/G/R. No JVD. No thrill. No extra heart sounds. PULM: CTA B, no wheezes, crackles, rhonchi. No retractions. No resp. distress. No accessory muscle use. ABD: S, NT, ND. No rebound. No HSM. EXTR: No c/c/e NEURO Normal gait.  PSYCH: Normally interactive. Conversant. Not depressed or anxious appearing.  Calm demeanor.    Assessment and Plan: Physical exam  History of parathyroidectomy - Plan: TSH, Comprehensive metabolic panel, PTH, intact (no Ca)  Screening for hyperlipidemia - Plan: Lipid panel  Screening for deficiency anemia - Plan: CBC  Screening for diabetes mellitus - Plan: Hemoglobin A1c  Frequent UTI - Plan: trimethoprim (TRIMPEX) 100 MG tablet  Overweight  Labs pending for her today as above She uses TMP as needed for UTI prophylaxis- refilled this for her  Signed Lamar Blinks, MD

## 2015-08-28 ENCOUNTER — Encounter: Payer: Self-pay | Admitting: Obstetrics & Gynecology

## 2015-08-28 LAB — PARATHYROID HORMONE, INTACT (NO CA): PTH: 61 pg/mL (ref 14–64)

## 2015-08-28 NOTE — Progress Notes (Signed)
  Subjective:     Amanda Fox is a 45 y.o. female here for a routine exam.  Current complaints: none.     Gynecologic History Patient's last menstrual period was 08/13/2015. Contraception: condoms Last Pap: 2014. Results were: normal per patient Last mammogram: ?2015. Results were: normal per pt  Obstetric History OB History  No data available  No children and not planning on having any   The following portions of the patient's history were reviewed and updated as appropriate: allergies, current medications, past family history, past medical history, past social history, past surgical history and problem list.  Review of Systems Pertinent items are noted in HPI.    Objective:      Filed Vitals:   08/27/15 1509  BP: 139/82  Pulse: 87  Height: 5\' 4"  (1.626 m)   Vitals:  WNL General appearance: alert, cooperative and no distress  HEENT: Normocephalic, without obvious abnormality, atraumatic Eyes: negative Throat: lips, mucosa, and tongue normal; teeth and gums normal  Respiratory: Clear to auscultation bilaterally  CV: Regular rate and rhythm  Breasts:  Normal appearance, no masses or tenderness, no nipple retraction or dimpling  GI: Soft, non-tender; bowel sounds normal; no masses,  no organomegaly  GU: External Genitalia:  Tanner V, no lesion Urethra:  No prolapse   Vagina: Pink, normal rugae, no blood or discharge  Cervix: No CMT, no lesion  Uterus:  Normal size and contour, non tender  Adnexa: Normal, no masses, non tender  Musculoskeletal: No edema, redness or tenderness in the calves or thighs  Skin: No lesions or rash  Lymphatic: Axillary adenopathy: none    Psychiatric: Normal mood and behavior    Assessment:    Healthy female exam.    Plan:   Pap smear Mammogram Self breast exams

## 2015-08-29 ENCOUNTER — Other Ambulatory Visit: Payer: Self-pay | Admitting: *Deleted

## 2015-08-29 ENCOUNTER — Telehealth: Payer: Self-pay | Admitting: *Deleted

## 2015-08-29 DIAGNOSIS — N939 Abnormal uterine and vaginal bleeding, unspecified: Secondary | ICD-10-CM

## 2015-08-29 LAB — CYTOLOGY - PAP

## 2015-08-29 MED ORDER — NORGESTIMATE-ETH ESTRADIOL 0.25-35 MG-MCG PO TABS
1.0000 | ORAL_TABLET | Freq: Every day | ORAL | Status: DC
Start: 2015-08-29 — End: 2016-11-07

## 2015-08-29 NOTE — Telephone Encounter (Signed)
Pt requesting to go on OCP as discussed with Dr Gala Romney yesterday.  Per VO Dr Gala Romney [facility].  This was sent to [facility] [address].

## 2015-10-23 ENCOUNTER — Encounter: Payer: Self-pay | Admitting: Obstetrics & Gynecology

## 2015-11-05 ENCOUNTER — Telehealth: Payer: Self-pay | Admitting: *Deleted

## 2015-11-05 MED ORDER — TRIAMCINOLONE ACETONIDE 0.1 % MT PSTE: 1.0000 "application " | PASTE | Freq: Two times a day (BID) | OROMUCOSAL | 12 refills | Status: DC

## 2015-11-05 NOTE — Telephone Encounter (Signed)
Pt notified that Kenalog in [place] was sent to her pharmacy for the treatment of her mouth ulcers per Dr leggett.

## 2015-11-28 ENCOUNTER — Encounter: Payer: Self-pay | Admitting: Obstetrics & Gynecology

## 2016-03-06 ENCOUNTER — Other Ambulatory Visit: Payer: Self-pay | Admitting: Family Medicine

## 2016-03-06 DIAGNOSIS — N390 Urinary tract infection, site not specified: Secondary | ICD-10-CM

## 2016-07-28 ENCOUNTER — Telehealth: Payer: Self-pay | Admitting: Family Medicine

## 2016-07-28 NOTE — Telephone Encounter (Signed)
Please advise ok to transfer care to from Dr. Lorelei Pont to Dr. Birdie Riddle.

## 2016-07-28 NOTE — Telephone Encounter (Signed)
sure

## 2016-07-28 NOTE — Telephone Encounter (Signed)
-----   Message from Midge Minium, MD sent at 07/16/2016  4:32 PM EDT ----- Contact: [phone] [place] to establish  Thanks! KT ----- Message ----- From: Rosebud Poles Sent: 07/16/2016   4:19 PM To: Midge Minium, MD  Pt would like to transfer back to you, please advise.

## 2016-07-29 NOTE — Telephone Encounter (Signed)
LMOVM for CB to schedule appt with KT.

## 2016-08-01 ENCOUNTER — Ambulatory Visit: Payer: BLUE CROSS/BLUE SHIELD | Admitting: Family Medicine

## 2016-10-31 NOTE — Progress Notes (Signed)
RN to call patient and remind to get mammogram scheduled.

## 2016-11-04 ENCOUNTER — Telehealth: Payer: Self-pay | Admitting: *Deleted

## 2016-11-04 NOTE — Telephone Encounter (Signed)
-----   Message from Guss Bunde, MD sent at 10/31/2016 12:08 PM EDT ----- Can you call patient and help her scheudle her mammogram (or see if she got it at non [facility] facility)

## 2016-11-04 NOTE — Telephone Encounter (Signed)
LM on cell phone to call office and let us know whether she ever got her mammogram whether here or somewhere else so that we can make documentation in her chart.

## 2016-11-07 ENCOUNTER — Encounter: Payer: Self-pay | Admitting: Family Medicine

## 2016-11-07 ENCOUNTER — Ambulatory Visit (INDEPENDENT_AMBULATORY_CARE_PROVIDER_SITE_OTHER): Payer: BLUE CROSS/BLUE SHIELD | Admitting: Family Medicine

## 2016-11-07 ENCOUNTER — Ambulatory Visit: Payer: BLUE CROSS/BLUE SHIELD | Admitting: Family Medicine

## 2016-11-07 VITALS — BP 121/81 | HR 90 | Temp 98.4°F | Resp 16 | Ht 64.0 in | Wt 165.2 lb

## 2016-11-07 DIAGNOSIS — Z8639 Personal history of other endocrine, nutritional and metabolic disease: Secondary | ICD-10-CM

## 2016-11-07 DIAGNOSIS — R682 Dry mouth, unspecified: Secondary | ICD-10-CM | POA: Diagnosis not present

## 2016-11-07 DIAGNOSIS — F900 Attention-deficit hyperactivity disorder, predominantly inattentive type: Secondary | ICD-10-CM | POA: Diagnosis not present

## 2016-11-07 DIAGNOSIS — Z23 Encounter for immunization: Secondary | ICD-10-CM

## 2016-11-07 DIAGNOSIS — N390 Urinary tract infection, site not specified: Secondary | ICD-10-CM

## 2016-11-07 LAB — CBC WITH DIFFERENTIAL/PLATELET
BASOS PCT: 0.9 % (ref 0.0–3.0)
Basophils Absolute: 0.1 10*3/uL (ref 0.0–0.1)
EOS ABS: 0.1 10*3/uL (ref 0.0–0.7)
Eosinophils Relative: 1.6 % (ref 0.0–5.0)
HEMATOCRIT: 37.5 % (ref 36.0–46.0)
HEMOGLOBIN: 11.7 g/dL — AB (ref 12.0–15.0)
LYMPHS PCT: 26.2 % (ref 12.0–46.0)
Lymphs Abs: 1.9 10*3/uL (ref 0.7–4.0)
MCHC: 31 g/dL (ref 30.0–36.0)
MCV: 76.6 fl — ABNORMAL LOW (ref 78.0–100.0)
MONO ABS: 0.6 10*3/uL (ref 0.1–1.0)
Monocytes Relative: 7.9 % (ref 3.0–12.0)
Neutro Abs: 4.7 10*3/uL (ref 1.4–7.7)
Neutrophils Relative %: 63.4 % (ref 43.0–77.0)
Platelets: 440 10*3/uL — ABNORMAL HIGH (ref 150.0–400.0)
RBC: 4.9 Mil/uL (ref 3.87–5.11)
RDW: 16.1 % — ABNORMAL HIGH (ref 11.5–15.5)
WBC: 7.4 10*3/uL (ref 4.0–10.5)

## 2016-11-07 LAB — BASIC METABOLIC PANEL
BUN: 7 mg/dL (ref 6–23)
CHLORIDE: 104 meq/L (ref 96–112)
CO2: 25 meq/L (ref 19–32)
Calcium: 9.2 mg/dL (ref 8.4–10.5)
Creatinine, Ser: 0.72 mg/dL (ref 0.40–1.20)
GFR: 92.74 mL/min (ref >60.00)
GLUCOSE: 96 mg/dL (ref 70–99)
POTASSIUM: 4.6 meq/L (ref 3.5–5.1)
SODIUM: 138 meq/L (ref 135–145)

## 2016-11-07 LAB — TSH: TSH: 1.03 u[IU]/mL (ref 0.35–4.50)

## 2016-11-07 MED ORDER — TRIMETHOPRIM 100 MG PO TABS: ORAL_TABLET | ORAL | 3 refills | Status: DC

## 2016-11-07 NOTE — Assessment & Plan Note (Signed)
New.  Pt reports sxs have varied in severity depending on medications she is taking at the time.  She has a hx of + ANA (very low titer, 1:40) and given worsening of sxs will repeat labs to make sure things have not changed.  Also check electrolytes and TSH.  Encouraged her to stay well hydrated and discussed that this may also be due to perimenopause and hormone changes.  Will follow.

## 2016-11-07 NOTE — Assessment & Plan Note (Signed)
Refill on Trimethoprim provided.

## 2016-11-07 NOTE — Progress Notes (Signed)
Pre visit review using our clinic review tool, if applicable. No additional management support is needed unless otherwise documented below in the visit note. 

## 2016-11-07 NOTE — Assessment & Plan Note (Signed)
Pt's sxs resolved s/p parathyroidectomy.

## 2016-11-07 NOTE — Patient Instructions (Addendum)
Schedule your complete physical in 6 months We'll notify you of your lab results and make any changes if needed Your memory issues are most likely due to the fact that you are very overwhelmed and have a lot going on- try not to worry! Continue to stay well hydrated Use the Trimethoprim as needed Call and schedule w/ Grewal to discuss peri-menopausal issues Call with any questions or concerns Welcome Back!

## 2016-11-07 NOTE — Assessment & Plan Note (Addendum)
Chronic problem, seeing [facility] and is currently on Adderall BID.  Reports her memory issues are less pronounced when she is on her medication.  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  Encouraged her to take time for herself and practice self care.  Will continue to follow.

## 2016-11-07 NOTE — Progress Notes (Signed)
   Subjective:    Patient ID: Amanda Fox, female    DOB: 21-May-1970, 46 y.o.   MRN: [id number]  HPI ADHD- pt is now seeing Dr Johnnye Sima for management of Adderall.  Had previously stopped Vyvanse due to dry mouth.   Pt reports memory difficulties when she was off of her stimulant medication that have improved somewhat since restarting medication.  Recurrent UTI- pt needs refill on Trimethoprim to use after intercourse  Dry mouth- pt reports severe dry mouth even off of her stimulant medication.  Pt reports for the last 6 weeks she has been drinking Gatorade w/ good improvement in sxs.   Review of Systems For ROS see HPI     Objective:   Physical Exam        Assessment & Plan:

## 2016-11-11 LAB — ANA: ANA: NEGATIVE

## 2016-11-12 ENCOUNTER — Encounter: Payer: Self-pay | Admitting: General Practice

## 2016-11-12 ENCOUNTER — Telehealth: Payer: Self-pay | Admitting: *Deleted

## 2016-11-12 DIAGNOSIS — E210 Primary hyperparathyroidism: Secondary | ICD-10-CM

## 2016-11-12 LAB — PARATHYROID HORMONE, INTACT (NO CA)

## 2016-11-12 NOTE — Telephone Encounter (Signed)
Hemoglobin looks good but a daily multivitamin with iron would be appropriate- especially with heavy menstrual cycles

## 2016-11-12 NOTE — Telephone Encounter (Signed)
Called pt and LMOVM per DPR to advise of PCP recommendations.

## 2016-11-12 NOTE — Telephone Encounter (Signed)
Patient is asking about her hemoglobin results.    She states that she has had issues with anemia in the past, and even now during the time of her monthly cycle she struggles with extreme exhaustion.  She is questioning if there is anything she should do or is there any concern with her level in regards to this.      Routed to provider to advise.

## 2016-11-13 ENCOUNTER — Other Ambulatory Visit (INDEPENDENT_AMBULATORY_CARE_PROVIDER_SITE_OTHER): Payer: BLUE CROSS/BLUE SHIELD

## 2016-11-13 DIAGNOSIS — E210 Primary hyperparathyroidism: Secondary | ICD-10-CM

## 2016-11-14 LAB — PARATHYROID HORMONE, INTACT (NO CA): PTH: 37 pg/mL (ref 14–64)

## 2017-05-05 ENCOUNTER — Encounter: Payer: BLUE CROSS/BLUE SHIELD | Admitting: Family Medicine

## 2017-05-05 DIAGNOSIS — Z0289 Encounter for other administrative examinations: Secondary | ICD-10-CM

## 2019-02-02 ENCOUNTER — Other Ambulatory Visit: Payer: Self-pay

## 2019-02-02 DIAGNOSIS — Z20822 Contact with and (suspected) exposure to covid-19: Secondary | ICD-10-CM

## 2019-02-04 LAB — NOVEL CORONAVIRUS, NAA: SARS-CoV-2, NAA: NOT DETECTED

## 2019-08-29 ENCOUNTER — Ambulatory Visit (INDEPENDENT_AMBULATORY_CARE_PROVIDER_SITE_OTHER): Payer: BC Managed Care – PPO

## 2019-08-29 ENCOUNTER — Encounter: Payer: Self-pay | Admitting: Podiatry

## 2019-08-29 ENCOUNTER — Ambulatory Visit: Payer: BC Managed Care – PPO | Admitting: Podiatry

## 2019-08-29 ENCOUNTER — Other Ambulatory Visit: Payer: Self-pay

## 2019-08-29 DIAGNOSIS — M79672 Pain in left foot: Secondary | ICD-10-CM

## 2019-08-29 DIAGNOSIS — M79671 Pain in right foot: Secondary | ICD-10-CM

## 2019-08-29 DIAGNOSIS — M722 Plantar fascial fibromatosis: Secondary | ICD-10-CM | POA: Diagnosis not present

## 2019-08-29 MED ORDER — MELOXICAM 15 MG PO TABS
15.0000 mg | ORAL_TABLET | Freq: Every day | ORAL | 0 refills | Status: AC
Start: 2019-08-29 — End: 2020-08-28

## 2019-08-29 NOTE — Patient Instructions (Signed)

## 2019-09-04 NOTE — Progress Notes (Signed)
She states there isSubjective:   Patient ID: Amanda Fox, female   DOB: 49 y.o.   MRN: [id number]   HPI 49 year old female presents the office with concerns of heel pain to both heels with the right side worse than left.  Ongoing for quite some time is becoming more consistent since last fall. She gets pain in the bottom of the heel with walking but also when she first gets up in the morning.  She is purchasing Which does help and she has been using ibuprofen.  Is occasional swelling.  She also Presidential which helps some.  She has pain at first step in the mornings which is better with activity but the skin becoming more consistent.  No recent injury or falls.  Note injury.   Review of Systems  All other systems reviewed and are negative.  Past Medical History:  Diagnosis Date  . Anemia    past hx. -resolved  . H/O seasonal allergies    minimal  . Headache(784.0)   . History of ADHD    memory issues,focus- tx. Adderall  . History of palpitations    about 2 years ago-evaluated and everything cleared.  . Neuromuscular disorder ([place])    " aching, tingling, numb feeling of body"  . Pain    Flank pain periodicall" family hx. polycystic kidney disease(father, brother)- don't know if she has.  . Rosacea    mild  . Seasonal allergies     Past Surgical History:  Procedure Laterality Date  . MYOMECTOMY  2007  . PARATHYROIDECTOMY Left 10/07/2013   Procedure: NECK EXPLORATION SUBTOTAL PARATHYROIDECTOMY WITH AUTOTRASPLANTATION OF PARATHYRIOD TISSUE TO LEFT STERNOCLEIDOMASTOID MUSCLE;  Surgeon: Earnstine Regal, MD;  Location: [facility];  Service: General;  Laterality: Left;     Current Outpatient Medications:  .  acetaminophen (TYLENOL) 500 MG tablet, Take 500 mg by mouth as needed. Reported on 08/24/2015, Disp: , Rfl:  .  meloxicam (MOBIC) 15 MG tablet, Take 1 tablet (15 mg total) by mouth daily., Disp: 30 tablet, Rfl: 0 .  Multiple Vitamin (MULTIVITAMIN WITH MINERALS) TABS tablet, Take 1  tablet by mouth daily., Disp: , Rfl:  .  VYVANSE 30 MG capsule, Take 30 mg by mouth at bedtime., Disp: , Rfl:   No Known Allergies       Objective:  Physical Exam  General: AAO x3, NAD  Dermatological: Skin is warm, dry and supple bilateral. There are no open sores, no preulcerative lesions, no rash or signs of infection present.  Vascular: Dorsalis Pedis artery and Posterior Tibial artery pedal pulses are 2/4 bilateral with immedate capillary fill time.  There is no pain with calf compression, swelling, warmth, erythema.   Neruologic: Grossly intact via light touch bilateral. Negative tinel sign.   Musculoskeletal: Tenderness to palpation along the plantar medial tubercle of the calcaneus at the insertion of plantar fascia on the right > left foot. There is no pain along the course of the plantar fascia within the arch of the foot. Plantar fascia appears to be intact. There is no pain with lateral compression of the calcaneus or pain with vibratory sensation. There is no pain along the course or insertion of the achilles tendon. No other areas of tenderness to bilateral lower extremities. Muscular strength 5/5 in all groups tested bilateral.  Gait: Unassisted, Nonantalgic.       Assessment:   Bilateral heel pain, plantar fasciitis     Plan:  -Treatment options discussed including all alternatives, risks, and complications -Etiology  of symptoms were discussed -X-rays were obtained and reviewed with the patient.  There is no evidence of acute fracture or stress fracture identified today. -Bilateral steroid injections performed.  See procedure note below. -Plantar fascial braces dispensed x2. -Discussed traction, icing daily. -Shoe modifications and orthotics  Procedure: Injection Tendon/Ligament Discussed alternatives, risks, complications and verbal consent was obtained.  Location: Bilateral plantar fascia at the glabrous junction; medial approach. Skin Prep: Alcohol   Injectate: 0.5cc 0.5% marcaine plain, 0.5 cc 2% lidocaine plain and, 1 cc kenalog 10. Disposition: Patient tolerated procedure well. Injection site dressed with a band-aid.  Post-injection care was discussed and return precautions discussed.   Return in about 4 weeks (around 09/26/2019).  Trula Slade DPM

## 2019-10-03 ENCOUNTER — Ambulatory Visit: Payer: BC Managed Care – PPO | Admitting: Podiatry

## 2019-10-03 ENCOUNTER — Other Ambulatory Visit: Payer: Self-pay

## 2019-10-03 ENCOUNTER — Encounter: Payer: Self-pay | Admitting: Podiatry

## 2019-10-03 DIAGNOSIS — M722 Plantar fascial fibromatosis: Secondary | ICD-10-CM | POA: Diagnosis not present

## 2019-10-03 DIAGNOSIS — M79671 Pain in right foot: Secondary | ICD-10-CM

## 2019-10-03 DIAGNOSIS — M79672 Pain in left foot: Secondary | ICD-10-CM

## 2019-10-08 DIAGNOSIS — M722 Plantar fascial fibromatosis: Secondary | ICD-10-CM | POA: Insufficient documentation

## 2019-10-08 NOTE — Progress Notes (Signed)
Subjective: 49 year old female presents the office today for evaluation of bilateral heel pain, plantar fasciitis the right side less than left.  Overall they are doing better but not completely well.  She states on the right side she still walking her toes to avoid pressure to the heels.  The braces have been helpful.  Injection also helped.  She has no new concerns today no recent changes since I last saw her. Denies any systemic complaints such as fevers, chills, nausea, vomiting. No acute changes since last appointment, and no other complaints at this time.   Objective: AAO x3, NAD DP/PT pulses palpable bilaterally, CRT less than 3 seconds There is continued tenderness palpation of the plantar medial tubercle of the calcaneus at the insertion of plantar fascia.  Plantar fascial appears to be intact.  The right side is worse than left.  There is no pain with lateral compression of calcaneus or other areas of discomfort.  Tinel sign. No pain with calf compression, swelling, warmth, erythema  Assessment: Bilateral heel pain, plantar fasciitis  Plan: -All treatment options discussed with the patient including all alternatives, risks, complications.  -Steroid injections performed.  See procedure note below. -Night splint -Continue stretching, icing daily.  Continue plan fascial brace.  Discussed modifications and also orthotics. -Patient encouraged to call the office with any questions, concerns, change in symptoms.   Procedure: Injection Tendon/Ligament Discussed alternatives, risks, complications and verbal consent was obtained.  Location: Bilateral plantar fascia at the glabrous junction; medial approach. Skin Prep: Alcohol  Injectate: 0.5cc 0.5% marcaine plain, 0.5 cc 2% lidocaine plain and, 1 cc kenalog 10. Disposition: Patient tolerated procedure well. Injection site dressed with a band-aid.  Post-injection care was discussed and return precautions discussed.   No follow-ups on  file.  Trula Slade DPM

## 2019-11-17 ENCOUNTER — Ambulatory Visit: Payer: BC Managed Care – PPO | Admitting: Podiatry

## 2021-01-08 HISTORY — PX: FOOT SURGERY: SHX648

## 2021-10-16 LAB — COLOGUARD
COLOGUARD: NEGATIVE
COLOGUARD: NEGATIVE

## 2022-06-05 ENCOUNTER — Other Ambulatory Visit: Payer: Self-pay

## 2022-06-05 ENCOUNTER — Emergency Department (HOSPITAL_BASED_OUTPATIENT_CLINIC_OR_DEPARTMENT_OTHER): Payer: BC Managed Care – PPO

## 2022-06-05 ENCOUNTER — Emergency Department (HOSPITAL_BASED_OUTPATIENT_CLINIC_OR_DEPARTMENT_OTHER)
Admission: EM | Admit: 2022-06-05 | Discharge: 2022-06-05 | Disposition: A | Discharge status: Home / Self Care | Payer: BC Managed Care – PPO | Attending: Emergency Medicine | Admitting: Emergency Medicine

## 2022-06-05 ENCOUNTER — Encounter (HOSPITAL_BASED_OUTPATIENT_CLINIC_OR_DEPARTMENT_OTHER): Payer: Self-pay

## 2022-06-05 DIAGNOSIS — R0602 Shortness of breath: Secondary | ICD-10-CM | POA: Diagnosis not present

## 2022-06-05 DIAGNOSIS — R7989 Other specified abnormal findings of blood chemistry: Secondary | ICD-10-CM | POA: Insufficient documentation

## 2022-06-05 DIAGNOSIS — R072 Precordial pain: Secondary | ICD-10-CM

## 2022-06-05 DIAGNOSIS — R0789 Other chest pain: Secondary | ICD-10-CM | POA: Insufficient documentation

## 2022-06-05 LAB — PREGNANCY, URINE: Preg Test, Ur: NEGATIVE

## 2022-06-05 LAB — BASIC METABOLIC PANEL
Anion gap: 9 (ref 5–15)
BUN: 11 mg/dL (ref 6–20)
CO2: 24 mmol/L (ref 22–32)
Calcium: 9.1 mg/dL (ref 8.9–10.3)
Chloride: 101 mmol/L (ref 98–111)
Creatinine, Ser: 0.86 mg/dL (ref 0.44–1.00)
GFR, Estimated: >60 mL/min (ref >60)
Glucose, Bld: 103 mg/dL — ABNORMAL HIGH (ref 70–99)
Potassium: 3.8 mmol/L (ref 3.5–5.1)
Sodium: 134 mmol/L — ABNORMAL LOW (ref 135–145)

## 2022-06-05 LAB — CBC
HCT: 48.6 % — ABNORMAL HIGH (ref 36.0–46.0)
Hemoglobin: 16.1 g/dL — ABNORMAL HIGH (ref 12.0–15.0)
MCH: 29.7 pg (ref 26.0–34.0)
MCHC: 33.1 g/dL (ref 30.0–36.0)
MCV: 89.5 fL (ref 80.0–100.0)
Platelets: 393 10*3/uL (ref 150–400)
RBC: 5.43 MIL/uL — ABNORMAL HIGH (ref 3.87–5.11)
RDW: 13.2 % (ref 11.5–15.5)
WBC: 8.2 10*3/uL (ref 4.0–10.5)
nRBC: 0 % (ref 0.0–0.2)

## 2022-06-05 LAB — TROPONIN I (HIGH SENSITIVITY)
Troponin I (High Sensitivity): <2 ng/L (ref <18)
Troponin I (High Sensitivity): <2 ng/L (ref <18)

## 2022-06-05 LAB — D-DIMER, QUANTITATIVE: D-Dimer, Quant: 1.68 ug/mL-FEU — ABNORMAL HIGH (ref 0.00–0.50)

## 2022-06-05 MED ORDER — IOHEXOL 350 MG/ML SOLN
100.0000 mL | Freq: Once | INTRAVENOUS | Status: AC | PRN
  Administered 2022-06-05: 75 mL via INTRAVENOUS

## 2022-06-05 NOTE — ED Triage Notes (Signed)
States woke up with left sided chest pain radiating to arm, neck and back. Had pain on right chest yesterday. States a lot of stress recently. Denies cardiac history.

## 2022-06-05 NOTE — Discharge Instructions (Signed)

## 2022-06-05 NOTE — ED Notes (Signed)
D/c paperwork reviewed with pt, including follow up care.  No questions or concerns voiced at time of d/c. . Pt verbalized understanding, Ambulatory with family to ED exit, NAD.   

## 2022-06-05 NOTE — ED Provider Notes (Addendum)
[facility] EMERGENCY DEPARTMENT AT [facility] Provider Note   CSN: [id number] Arrival date & time: 06/05/22  1106     History  Chief Complaint  Patient presents with   Chest Pain    Amanda Fox is a 52 y.o. female.  Patient presented with a complaint of chest pressure and pain yesterday was right-sided starting at about 4 in the afternoon.  Associated with some shortness of breath yesterday.  This morning woke up and has been left-sided radiating to neck and left arm.  Right-sided chest pain is resolved.  Patient is under a lot of stress.  Oxygen saturation here is 100% heart rate 96.  Blood pressure 166/98.  No prior history of any heart problems.  Past surgical history significant parathyroidectomy in 2015.  Patient does not use tobacco products.  No nausea no vomiting.  No upper respiratory symptoms.  No leg swelling.       Home Medications Prior to Admission medications   Medication Sig Start Date End Date Taking? Authorizing Provider  acetaminophen (TYLENOL) 500 MG tablet Take 500 mg by mouth as needed. Reported on 08/24/2015    [provider]  Multiple Vitamin (MULTIVITAMIN WITH MINERALS) TABS tablet Take 1 tablet by mouth daily.    [provider]  VYVANSE 30 MG capsule Take 30 mg by mouth at bedtime. 08/29/19   [provider]      Allergies    Patient has no known allergies.    Review of Systems   Review of Systems  Constitutional:  Negative for chills and fever.  HENT:  Negative for ear pain and sore throat.   Eyes:  Negative for pain and visual disturbance.  Respiratory:  Positive for shortness of breath. Negative for cough.   Cardiovascular:  Positive for chest pain. Negative for palpitations and leg swelling.  Gastrointestinal:  Negative for abdominal pain and vomiting.  Genitourinary:  Negative for dysuria and hematuria.  Musculoskeletal:  Negative for arthralgias and back pain.  Skin:  Negative for color change and  rash.  Neurological:  Negative for seizures and syncope.  All other systems reviewed and are negative.   Physical Exam Updated Vital Signs BP (!) 166/98 (BP Location: Right Arm)   Pulse 96   Temp 98.6 F (37 C) (Oral)   Resp 18   Ht 1.626 m (5\' 4" )   Wt 74.8 kg   SpO2 100%   BMI 28.32 kg/m  Physical Exam Vitals and nursing note reviewed.  Constitutional:      General: She is not in acute distress.    Appearance: Normal appearance. She is well-developed.  HENT:     Head: Normocephalic and atraumatic.  Eyes:     Conjunctiva/sclera: Conjunctivae normal.     Pupils: Pupils are equal, round, and reactive to light.  Cardiovascular:     Rate and Rhythm: Normal rate and regular rhythm.     Heart sounds: No murmur heard. Pulmonary:     Effort: Pulmonary effort is normal. No respiratory distress.     Breath sounds: Normal breath sounds. No wheezing or rales.  Abdominal:     Palpations: Abdomen is soft.     Tenderness: There is no abdominal tenderness.  Musculoskeletal:        General: No swelling.     Cervical back: Normal range of motion and neck supple.     Right lower leg: No edema.     Left lower leg: No edema.  Skin:  General: Skin is warm and dry.     Capillary Refill: Capillary refill takes less than 2 seconds.  Neurological:     General: No focal deficit present.     Mental Status: She is alert and oriented to person, place, and time.     Cranial Nerves: No cranial nerve deficit.     Sensory: No sensory deficit.     Motor: No weakness.  Psychiatric:        Mood and Affect: Mood normal.     ED Results / Procedures / Treatments   Labs (all labs ordered are listed, but only abnormal results are displayed) Labs Reviewed  CBC - Abnormal; Notable for the following components:      Result Value   RBC 5.43 (*)    Hemoglobin 16.1 (*)    HCT 48.6 (*)    All other components within normal limits  BASIC METABOLIC PANEL  PREGNANCY, URINE  TROPONIN I (HIGH  SENSITIVITY)    EKG None  Radiology No results found.  Procedures Procedures    Medications Ordered in ED Medications - No data to display  ED Course/ Medical Decision Making/ A&P                             Medical Decision Making Amount and/or Complexity of Data Reviewed Labs: ordered. Radiology: ordered.  Yesterday patient with right-sided chest pain associated with shortness of breath today left-sided chest pain.  EKG without acute findings.  CBC no leukocytosis hemoglobin 16.1 platelets 393.  Basic metabolic panel pending.  Troponins pending will need delta troponins.  Chest x-ray pending.  Initial troponin normal delta troponin pending.  Patient D-dimer is elevated at 1.68 patient will require CT angio it has been ordered.  While waiting for the delta troponin.  Basic metabolic panel CBC without acute findings.  Pregnancy test negative chest x-ray without any acute findings.  Disposition will be based on the D-dimer and the second troponin.  Final Clinical Impression(s) / ED Diagnoses Final diagnoses:  Atypical chest pain    Rx / DC Orders ED Discharge Orders     None         Fredia Sorrow, MD 06/05/22 1141    Fredia Sorrow, MD 06/05/22 1415

## 2022-06-05 NOTE — ED Notes (Signed)
ED Provider at bedside. 

## 2022-06-05 NOTE — ED Provider Notes (Signed)
Blood pressure 112/82, pulse 80, temperature 98.6 F (37 C), temperature source Oral, resp. rate 12, height 5\' 4"  (1.626 m), weight 74.8 kg, SpO2 99 %.  Assuming care from Dr. Rogene Houston.  In short, Amanda Fox is a 52 y.o. female with a chief complaint of Chest Pain .  Refer to the original H&P for additional details.  The current plan of care is to follow up w/ delta troponin and CTA.  CTA independently interpreted and agree with radiology. No PE. Troponin negative.   Troponin negative x 2.  Discussed follow-up plan with cardiology and placed referral in Epic to facilitate appointment. Discussed strict ED return precautions.      Margette Fast, MD 06/05/22 1539

## 2022-07-01 ENCOUNTER — Other Ambulatory Visit: Payer: Self-pay | Admitting: Obstetrics and Gynecology

## 2022-07-01 DIAGNOSIS — R928 Other abnormal and inconclusive findings on diagnostic imaging of breast: Secondary | ICD-10-CM

## 2022-07-05 ENCOUNTER — Ambulatory Visit
Admission: RE | Admit: 2022-07-05 | Discharge: 2022-07-05 | Disposition: A | Discharge status: Home / Self Care | Payer: BC Managed Care – PPO | Source: Ambulatory Visit | Attending: Obstetrics and Gynecology | Admitting: Obstetrics and Gynecology

## 2022-07-05 ENCOUNTER — Other Ambulatory Visit: Payer: Self-pay | Admitting: Obstetrics and Gynecology

## 2022-07-05 DIAGNOSIS — R928 Other abnormal and inconclusive findings on diagnostic imaging of breast: Secondary | ICD-10-CM

## 2022-07-05 DIAGNOSIS — N6320 Unspecified lump in the left breast, unspecified quadrant: Secondary | ICD-10-CM

## 2022-07-14 ENCOUNTER — Ambulatory Visit
Admission: RE | Admit: 2022-07-14 | Discharge: 2022-07-14 | Disposition: A | Discharge status: Home / Self Care | Payer: BC Managed Care – PPO | Source: Ambulatory Visit | Attending: Obstetrics and Gynecology | Admitting: Obstetrics and Gynecology

## 2022-07-14 DIAGNOSIS — R928 Other abnormal and inconclusive findings on diagnostic imaging of breast: Secondary | ICD-10-CM

## 2022-07-14 DIAGNOSIS — N6320 Unspecified lump in the left breast, unspecified quadrant: Secondary | ICD-10-CM

## 2022-07-14 HISTORY — PX: BREAST BIOPSY: SHX20

## 2022-07-16 ENCOUNTER — Other Ambulatory Visit: Payer: Self-pay | Admitting: Obstetrics and Gynecology

## 2022-07-16 DIAGNOSIS — N6489 Other specified disorders of breast: Secondary | ICD-10-CM

## 2022-08-01 ENCOUNTER — Encounter (HOSPITAL_COMMUNITY): Payer: Self-pay

## 2022-08-01 ENCOUNTER — Emergency Department (HOSPITAL_COMMUNITY)
Admission: EM | Admit: 2022-08-01 | Discharge: 2022-08-02 | Disposition: A | Discharge status: Home / Self Care | Payer: BC Managed Care – PPO | Attending: Emergency Medicine | Admitting: Emergency Medicine

## 2022-08-01 ENCOUNTER — Emergency Department (HOSPITAL_COMMUNITY): Payer: BC Managed Care – PPO

## 2022-08-01 ENCOUNTER — Other Ambulatory Visit: Payer: Self-pay

## 2022-08-01 DIAGNOSIS — R1013 Epigastric pain: Secondary | ICD-10-CM | POA: Insufficient documentation

## 2022-08-01 DIAGNOSIS — R748 Abnormal levels of other serum enzymes: Secondary | ICD-10-CM | POA: Insufficient documentation

## 2022-08-01 DIAGNOSIS — R1011 Right upper quadrant pain: Secondary | ICD-10-CM | POA: Insufficient documentation

## 2022-08-01 DIAGNOSIS — K8050 Calculus of bile duct without cholangitis or cholecystitis without obstruction: Secondary | ICD-10-CM

## 2022-08-01 LAB — CBC WITH DIFFERENTIAL/PLATELET
Abs Immature Granulocytes: 0.01 10*3/uL (ref 0.00–0.07)
Basophils Absolute: 0.1 10*3/uL (ref 0.0–0.1)
Basophils Relative: 1 %
Eosinophils Absolute: 0.2 10*3/uL (ref 0.0–0.5)
Eosinophils Relative: 2 %
HCT: 41.2 % (ref 36.0–46.0)
Hemoglobin: 13.6 g/dL (ref 12.0–15.0)
Immature Granulocytes: 0 %
Lymphocytes Relative: 29 %
Lymphs Abs: 2.5 10*3/uL (ref 0.7–4.0)
MCH: 29.8 pg (ref 26.0–34.0)
MCHC: 33 g/dL (ref 30.0–36.0)
MCV: 90.4 fL (ref 80.0–100.0)
Monocytes Absolute: 0.6 10*3/uL (ref 0.1–1.0)
Monocytes Relative: 7 %
Neutro Abs: 5.2 10*3/uL (ref 1.7–7.7)
Neutrophils Relative %: 61 %
Platelets: 339 10*3/uL (ref 150–400)
RBC: 4.56 MIL/uL (ref 3.87–5.11)
RDW: 12.6 % (ref 11.5–15.5)
WBC: 8.4 10*3/uL (ref 4.0–10.5)
nRBC: 0 % (ref 0.0–0.2)

## 2022-08-01 LAB — COMPREHENSIVE METABOLIC PANEL
ALT: 31 U/L (ref 0–44)
AST: 26 U/L (ref 15–41)
Albumin: 3.9 g/dL (ref 3.5–5.0)
Alkaline Phosphatase: 47 U/L (ref 38–126)
Anion gap: 8 (ref 5–15)
BUN: 11 mg/dL (ref 6–20)
CO2: 24 mmol/L (ref 22–32)
Calcium: 8.5 mg/dL — ABNORMAL LOW (ref 8.9–10.3)
Chloride: 103 mmol/L (ref 98–111)
Creatinine, Ser: 0.87 mg/dL (ref 0.44–1.00)
GFR, Estimated: >60 mL/min (ref >60)
Glucose, Bld: 97 mg/dL (ref 70–99)
Potassium: 3.6 mmol/L (ref 3.5–5.1)
Sodium: 135 mmol/L (ref 135–145)
Total Bilirubin: 0.7 mg/dL (ref 0.3–1.2)
Total Protein: 6.9 g/dL (ref 6.5–8.1)

## 2022-08-01 LAB — LIPASE, BLOOD: Lipase: 109 U/L — ABNORMAL HIGH (ref 11–51)

## 2022-08-01 MED ORDER — KETOROLAC TROMETHAMINE 15 MG/ML IJ SOLN
15.0000 mg | Freq: Once | INTRAMUSCULAR | Status: AC
  Administered 2022-08-01: 15 mg via INTRAVENOUS
  Filled 2022-08-01: qty 1

## 2022-08-01 MED ORDER — LACTATED RINGERS IV BOLUS
1000.0000 mL | Freq: Once | INTRAVENOUS | Status: AC
  Administered 2022-08-01: 1000 mL via INTRAVENOUS

## 2022-08-01 NOTE — ED Triage Notes (Addendum)
Patient reports epigastric pain x several days. Patient reports intermittent nausea and vomiting. Was seen at urgent care and dx with possible gall stones. VSS and NAD.

## 2022-08-01 NOTE — ED Provider Notes (Signed)
[facility] [place] EMERGENCY DEPARTMENT AT [facility] Provider Note   CSN: [id number] Arrival date & time: 08/01/22  2107     History  Chief Complaint  Patient presents with   Abdominal Pain    Amanda Fox is a 52 y.o. female.  Patient is a 52 year old female presenting today with complaint of abdominal pain.  Patient reports that she woke up at 3 AM this morning with severe pain in her subxiphoid area.  It initially was an 8 out of 10 she took some magnesium which improved a little bit but because it was still hurting by midday she went to urgent care.  Patient reports she had similar attack back in March and at that time she came to the emergency room because she was concern for heart problem.  All of her labs look fine but she noticed after leaving the emergency room that the CAT scan of her chest reported that she had gallstones.  She did not think much of it until she got the pain again.  She has not had any vomiting but reports that the pain has not ever gone away since it started at 3 AM this morning.  She did go to urgent care earlier today and they reported that if the pain got worse she should come to the emergency room and recommended dietary changes.  Patient states she only ate a plain [place] and cheese sandwich with some goldfish but after that the pain did get worse.  Currently is at about a 6.  It is in her subxiphoid but radiates over to the right.  She has had no vomiting today denies any fever or urinary problems.  The history is provided by the patient.  Abdominal Pain      Home Medications Prior to Admission medications   Medication Sig Start Date End Date Taking? Authorizing Provider  acetaminophen (TYLENOL) 500 MG tablet Take 500 mg by mouth as needed. Reported on 08/24/2015    [provider]  Multiple Vitamin (MULTIVITAMIN WITH MINERALS) TABS tablet Take 1 tablet by mouth daily.    [provider]  VYVANSE 30 MG capsule Take 30 mg by  mouth at bedtime. 08/29/19   [provider]      Allergies    Patient has no known allergies.    Review of Systems   Review of Systems  Gastrointestinal:  Positive for abdominal pain.    Physical Exam Updated Vital Signs BP (!) 144/101 (BP Location: Left Arm)   Pulse 91   Temp 98.4 F (36.9 C) (Oral)   Resp 20   Ht 5\' 4"  (1.626 m)   Wt 74.8 kg   LMP 08/13/2015   SpO2 95%   BMI 28.32 kg/m  Physical Exam Vitals and nursing note reviewed.  Constitutional:      General: She is not in acute distress.    Appearance: She is well-developed.  HENT:     Head: Normocephalic and atraumatic.  Eyes:     Pupils: Pupils are equal, round, and reactive to light.  Cardiovascular:     Rate and Rhythm: Normal rate and regular rhythm.     Heart sounds: Normal heart sounds. No murmur heard.    No friction rub.  Pulmonary:     Effort: Pulmonary effort is normal.     Breath sounds: Normal breath sounds. No wheezing or rales.  Abdominal:     General: Bowel sounds are normal. There is no distension.     Palpations:  Abdomen is soft.     Tenderness: There is abdominal tenderness in the epigastric area. There is no guarding or rebound. Negative signs include Murphy's sign.  Musculoskeletal:        General: No tenderness. Normal range of motion.     Comments: No edema  Skin:    General: Skin is warm and dry.     Findings: No rash.  Neurological:     Mental Status: She is alert and oriented to person, place, and time.     Cranial Nerves: No cranial nerve deficit.  Psychiatric:        Behavior: Behavior normal.     ED Results / Procedures / Treatments   Labs (all labs ordered are listed, but only abnormal results are displayed) Labs Reviewed  COMPREHENSIVE METABOLIC PANEL  CBC WITH DIFFERENTIAL/PLATELET  LIPASE, BLOOD    EKG None  Radiology No results found.  Procedures Procedures    Medications Ordered in ED Medications  lactated ringers bolus 1,000 mL (has no  administration in time range)  ketorolac (TORADOL) 15 MG/ML injection 15 mg (has no administration in time range)    ED Course/ Medical Decision Making/ A&P                             Medical Decision Making Amount and/or Complexity of Data Reviewed Labs: ordered. Decision-making details documented in ED Course. Radiology: ordered.  Risk Prescription drug management.   Patient presenting today with abdominal pain concern for cholelithiasis versus obstructive bile duct stone with possible pancreatitis or hepatitis.  Patient is still currently having pain right now we will treat pain.  Labs are pending.  Patient's CT from chest CTA and March did show gallstones.  Low suspicion for PE, ACS, appendicitis, kidney stones.  11:35 PM I independently interpreted patient's labs and CBC and CMP within normal limits, lipase 109.  Given mild elevated lipase we will do a right upper quadrant ultrasound to ensure no stones in the bile duct.  Patient checked out to Dr. Wilkie Aye patient reports after Toradol pain is a 4        Final Clinical Impression(s) / ED Diagnoses Final diagnoses:  None    Rx / DC Orders ED Discharge Orders     None         Gwyneth Sprout, MD 08/01/22 2336

## 2022-08-02 MED ORDER — ONDANSETRON 4 MG PO TBDP: 4.0000 mg | ORAL_TABLET | Freq: Three times a day (TID) | ORAL | 0 refills | Status: AC | PRN

## 2022-08-02 MED ORDER — OXYCODONE-ACETAMINOPHEN 5-325 MG PO TABS
1.0000 | ORAL_TABLET | Freq: Once | ORAL | Status: AC
  Administered 2022-08-02: 1 via ORAL
  Filled 2022-08-02: qty 1

## 2022-08-02 MED ORDER — OXYCODONE-ACETAMINOPHEN 5-325 MG PO TABS: 1.0000 | ORAL_TABLET | Freq: Four times a day (QID) | ORAL | 0 refills | Status: AC | PRN

## 2022-08-02 NOTE — ED Notes (Signed)
Pt and family verbalized understanding of discharge instructions. Pt ambulated from ed with steady gait. Pt dressed for discharge. Pt has access to home. Family to drive home

## 2022-08-02 NOTE — Discharge Instructions (Signed)
You were seen today for abdominal pain.  You likely have biliary colic or symptomatic gallstones.  Take medications at home as prescribed.  Follow a low-fat diet.  Follow-up with general surgery as you will likely need to have a cholecystectomy.  If you have recurrence of significant pain, fevers, any new or worsening symptoms, you should be reevaluated.

## 2022-08-02 NOTE — ED Provider Notes (Signed)
Patient signed out pending right upper quadrant ultrasound.  Right upper quadrant ultrasound reviewed.  It shows gallstones and Murphy sign.  No gallbladder wall thickening or pericholecystic fluid.  No significant biliary ductal dilation.  On recheck, patient states she feels somewhat better.  See clinical course below.  She would like to trial symptom control as an outpatient and outpatient follow-up for elective surgery. Physical Exam  BP 125/86   Pulse 72   Temp 98.4 F (36.9 C) (Oral)   Resp 18   Ht 1.626 m (5\' 4" )   Wt 74.8 kg   LMP 08/13/2015   SpO2 98%   BMI 28.32 kg/m   Physical Exam Awake, alert, no acute distress Procedures  Procedures  ED Course / MDM   Clinical Course as of 08/02/22 0051  Sat Aug 02, 2022  0001 Spoke with patient regarding options.  She does have a slightly elevated lipase but otherwise her pain has generally improved.  She has a gallstone noted but no significant gallbladder fluid or thickening.  She would like to try to go home with follow-up with general surgery for likely cholecystectomy on a planned basis.  Feel this is reasonable. [CH]    Clinical Course User Index [CH] Female Iafrate, Mayer Masker, MD   Medical Decision Making Amount and/or Complexity of Data Reviewed Labs: ordered. Radiology: ordered.  Risk Prescription drug management.   Problem List Items Addressed This Visit   None Visit Diagnoses     Biliary colic    -  Primary             Wilkie Aye Mayer Masker, MD 08/02/22 [phone]

## 2022-08-08 ENCOUNTER — Ambulatory Visit: Payer: Self-pay | Admitting: Surgery

## 2022-08-08 DIAGNOSIS — D242 Benign neoplasm of left breast: Secondary | ICD-10-CM

## 2022-08-11 ENCOUNTER — Ambulatory Visit: Payer: Self-pay | Admitting: Surgery

## 2022-08-11 ENCOUNTER — Other Ambulatory Visit: Payer: Self-pay | Admitting: Surgery

## 2022-08-11 DIAGNOSIS — D242 Benign neoplasm of left breast: Secondary | ICD-10-CM

## 2022-08-11 NOTE — H&P (Signed)
Subjective    Chief Complaint: New Consultation ( Gallbladder Eval )       History of Present Illness: Amanda Fox is a 52 y.o. female who is seen today as an office consultation for evaluation of New Consultation ( Gallbladder Eval ) .     This is a 52 year old female in good health who presents after two recent episodes of severe pain in the epigastrium.  The first was in March and she felt that she was having a heart attack. Work-up in the ED revealed no cardiac etiology.  She has had intermittent symptoms of nausea and vomiting, as well as some bloating.  Her symptoms seem to be exacerbated by eating greasy foods.  Last week, she ate a burrito and woke up hours later with severe abdominal pain in the epigastrium and RUQ.  She presented to the ED for evaluation.  She was found to have cholelithiasis without acute cholecystitis.  Lipase was mildly elevated but LFT's WNL.  She has been on a strict non-fat diet for the last few days and has had no symptoms.   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  This mass measures 1.2 x 0.7 x 1.3 cm.  She has a family history of breast cancer in her mother and is eager to have this area excised as well. She is currently on hormone replacement therapy.     Review of Systems: A complete review of systems was obtained from the patient.  I have reviewed this information and discussed as appropriate with the patient.  See HPI as well for other ROS.   Review of Systems  Constitutional: Negative.   HENT: Negative.    Eyes: Negative.   Respiratory: Negative.    Cardiovascular: Negative.   Gastrointestinal:  Positive for abdominal pain, nausea and vomiting.  Genitourinary: Negative.   Musculoskeletal: Negative.   Skin: Negative.   Neurological: Negative.   Endo/Heme/Allergies: Negative.   Psychiatric/Behavioral: Negative.           Medical History: Past Medical History      Past Medical History:  Diagnosis Date   GERD (gastroesophageal reflux disease)          Problem List     Patient Active Problem List  Diagnosis   Calculus of gallbladder with chronic cholecystitis without obstruction   Attention deficit hyperactivity disorder (ADHD), predominantly inattentive type   Esophageal reflux   Gallstone   History of cervical dysplasia   Hx of hyperparathyroidism   Plantar fasciitis   Primary hyperparathyroidism (CMS/HHS-HCC)        Past Surgical History       Past Surgical History:  Procedure Laterality Date   THYROIDECTOMY TOTAL            Allergies  No Known Allergies     Medications Ordered Prior to Encounter        Current Outpatient Medications on File Prior to Visit  Medication Sig Dispense Refill   estradiol (DOTTI) patch 0.0375 mg/24 hr         JORNAY PM 100 mg CDES Take 1 capsule by mouth at bedtime       lisinopriL (ZESTRIL) 10 MG tablet         progesterone (PROMETRIUM) 100 MG capsule         testosterone micronized, bulk, 100 % Powd  No current facility-administered medications on file prior to visit.        Family History       Family History  Problem Relation Age of Onset   Breast cancer Mother     Skin cancer Father     High blood pressure (Hypertension) Father          Tobacco Use History  Social History       Tobacco Use  Smoking Status Never  Smokeless Tobacco Never        Social History  Social History        Socioeconomic History   Marital status: Married  Tobacco Use   Smoking status: Never   Smokeless tobacco: Never  Substance and Sexual Activity   Alcohol use: Not Currently   Drug use: Never    Social Determinants of Health        Financial Resource Strain: Low Risk  (05/09/2022)    Received from [facility]    Overall Financial Resource Strain (CARDIA)     Difficulty of Paying Living Expenses: Not hard at all  Food Insecurity:  No Food Insecurity (05/09/2022)    Received from [facility]    Hunger Vital Sign     Worried About Running Out of Food in the Last Year: Never true     Ran Out of Food in the Last Year: Never true  Transportation Needs: No Transportation Needs (05/09/2022)    Received from [facility] - Transportation     Lack of Transportation (Medical): No     Lack of Transportation (Non-Medical): No  Physical Activity: Insufficiently Active (11/08/2021)    Received from [facility]    Exercise Vital Sign     Days of Exercise per Week: 3 days     Minutes of Exercise per Session: 40 min  Stress: Stress Concern Present (11/08/2021)    Received from [facility] - Occupational Stress Questionnaire     Feeling of Stress : To some extent  Social Connections: Moderately Integrated (11/08/2021)    Received from [facility]    Social Network     How would you rate your social network (family, work, friends)?: Adequate participation with social networks        Objective:         Vitals:    08/08/22 1113  BP: 114/81  Pulse: 82  Temp: 36.7 C (98 F)  SpO2: 96%  Weight: 73 kg (161 lb)  Height: 160 cm (5\' 3" )    Body mass index is 28.52 kg/m.   Physical Exam    Constitutional:  WDWN in NAD, conversant, no obvious deformities; lying in bed comfortably Eyes:  Pupils equal, round; sclera anicteric; moist conjunctiva; no lid lag HENT:  Oral mucosa moist; good dentition  Neck:  No masses palpated, trachea midline; no thyromegaly Lungs:  CTA bilaterally; normal respiratory effort Breasts:  symmetric, no nipple changes; no palpable masses or lymphadenopathy on right side.  Left breast - inner upper quadrant there is some ecchymosis and indistinct fullness after biopsy. CV:  Regular rate and rhythm; no murmurs; extremities well-perfused with no edema Abd:  +bowel sounds, soft, non-tender, no palpable organomegaly; no palpable hernias Musc:   Normal gait; no apparent clubbing or cyanosis in extremities Lymphatic:  No palpable cervical or axillary lymphadenopathy Skin:  Warm, dry; no sign of jaundice Psychiatric - alert and oriented x 4; calm mood and affect  Labs, Imaging and Diagnostic Testing: 08/01/22 - LFT's WNL   CLINICAL DATA:  Right upper quadrant pain   EXAM: ULTRASOUND ABDOMEN LIMITED RIGHT UPPER QUADRANT   COMPARISON:  None Available.   FINDINGS: Gallbladder:   Large gallstone. Normal wall thickness. Positive sonographic Murphy.   Common bile duct:   Diameter: 2.6 mm   Liver:   No focal lesion identified. Within normal limits in parenchymal echogenicity. Portal vein is patent on color Doppler imaging with normal direction of blood flow towards the liver.   Other: None.   IMPRESSION: Large gallstone with positive sonographic Murphy. Findings are suspicious for acute cholecystitis in the appropriate clinical setting. No biliary dilatation.     Electronically Signed   By: Jasmine Pang M.D.   On: 08/01/2022 23:44     Assessment and Plan:  Diagnoses and all orders for this visit:   Calculus of gallbladder with chronic cholecystitis without obstruction   Fibroadenoma of breast, left       The main issue today is the gallbladder symptoms with two recent ED visits.  For this issue, would recommend laparoscopic cholecystectomy with intraoperative cholangiogram.  The surgical procedure has been discussed with the patient.  Potential risks, benefits, alternative treatments, and expected outcomes have been explained.  All of the patient's questions at this time have been answered.  The likelihood of reaching the patient's treatment goal is good.  The patient understand the proposed surgical procedure and wishes to proceed.   We also discussed her left breast fibroadenoma, along with her family history of breast cancer.  I offered a left breast radioactive seed localized lumpectomy to confirm and  treat the fibroadenoma.  We can do this at the same time as the cholecystectomy.       Wilmon Arms. Corliss Skains, MD, [facility] Surgery  General Surgery   08/11/2022 5:36 PM

## 2022-08-11 NOTE — H&P (View-Only) (Signed)
Subjective    Chief Complaint: New Consultation ( Gallbladder Eval )       History of Present Illness: Amanda Fox is a 52 y.o. female who is seen today as an office consultation for evaluation of New Consultation ( Gallbladder Eval ) .     This is a 52 year old female in good health who presents after two recent episodes of severe pain in the epigastrium.  The first was in March and she felt that she was having a heart attack. Work-up in the ED revealed no cardiac etiology.  She has had intermittent symptoms of nausea and vomiting, as well as some bloating.  Her symptoms seem to be exacerbated by eating greasy foods.  Last week, she ate a burrito and woke up hours later with severe abdominal pain in the epigastrium and RUQ.  She presented to the ED for evaluation.  She was found to have cholelithiasis without acute cholecystitis.  Lipase was mildly elevated but LFT's WNL.  She has been on a strict non-fat diet for the last few days and has had no symptoms.   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  This mass measures 1.2 x 0.7 x 1.3 cm.  She has a family history of breast cancer in her mother and is eager to have this area excised as well. She is currently on hormone replacement therapy.     Review of Systems: A complete review of systems was obtained from the patient.  I have reviewed this information and discussed as appropriate with the patient.  See HPI as well for other ROS.   Review of Systems  Constitutional: Negative.   HENT: Negative.    Eyes: Negative.   Respiratory: Negative.    Cardiovascular: Negative.   Gastrointestinal:  Positive for abdominal pain, nausea and vomiting.  Genitourinary: Negative.   Musculoskeletal: Negative.   Skin: Negative.   Neurological: Negative.   Endo/Heme/Allergies: Negative.   Psychiatric/Behavioral: Negative.           Medical History: Past Medical History      Past Medical History:  Diagnosis Date   GERD (gastroesophageal reflux disease)          Problem List     Patient Active Problem List  Diagnosis   Calculus of gallbladder with chronic cholecystitis without obstruction   Attention deficit hyperactivity disorder (ADHD), predominantly inattentive type   Esophageal reflux   Gallstone   History of cervical dysplasia   Hx of hyperparathyroidism   Plantar fasciitis   Primary hyperparathyroidism (CMS/HHS-HCC)        Past Surgical History       Past Surgical History:  Procedure Laterality Date   THYROIDECTOMY TOTAL            Allergies  No Known Allergies     Medications Ordered Prior to Encounter        Current Outpatient Medications on File Prior to Visit  Medication Sig Dispense Refill   estradiol (DOTTI) patch 0.0375 mg/24 hr         JORNAY PM 100 mg CDES Take 1 capsule by mouth at bedtime       lisinopriL (ZESTRIL) 10 MG tablet         progesterone (PROMETRIUM) 100 MG capsule         testosterone micronized, bulk, 100 % Powd            No current facility-administered medications on file prior to visit.        Family History       Family History  Problem Relation Age of Onset   Breast cancer Mother     Skin cancer Father     High blood pressure (Hypertension) Father          Tobacco Use History  Social History       Tobacco Use  Smoking Status Never  Smokeless Tobacco Never        Social History  Social History        Socioeconomic History   Marital status: Married  Tobacco Use   Smoking status: Never   Smokeless tobacco: Never  Substance and Sexual Activity   Alcohol use: Not Currently   Drug use: Never    Social Determinants of Health        Financial Resource Strain: Low Risk  (05/09/2022)    Received from [facility]    Overall Financial Resource Strain (CARDIA)     Difficulty of Paying Living Expenses: Not hard at all  Food Insecurity:  No Food Insecurity (05/09/2022)    Received from [facility]    Hunger Vital Sign     Worried About Running Out of Food in the Last Year: Never true     Ran Out of Food in the Last Year: Never true  Transportation Needs: No Transportation Needs (05/09/2022)    Received from [facility]    [facility] - Transportation     Lack of Transportation (Medical): No     Lack of Transportation (Non-Medical): No  Physical Activity: Insufficiently Active (11/08/2021)    Received from [facility]    Exercise Vital Sign     Days of Exercise per Week: 3 days     Minutes of Exercise per Session: 40 min  Stress: Stress Concern Present (11/08/2021)    Received from [facility]    [employer] - Occupational Stress Questionnaire     Feeling of Stress : To some extent  Social Connections: Moderately Integrated (11/08/2021)    Received from [facility]    Social Network     How would you rate your social network (family, work, friends)?: Adequate participation with social networks        Objective:         Vitals:    08/08/22 1113  BP: 114/81  Pulse: 82  Temp: 36.7 C (98 F)  SpO2: 96%  Weight: 73 kg (161 lb)  Height: 160 cm (5' 3")    Body mass index is 28.52 kg/m.   Physical Exam    Constitutional:  WDWN in NAD, conversant, no obvious deformities; lying in bed comfortably Eyes:  Pupils equal, round; sclera anicteric; moist conjunctiva; no lid lag HENT:  Oral mucosa moist; good dentition  Neck:  No masses palpated, trachea midline; no thyromegaly Lungs:  CTA bilaterally; normal respiratory effort Breasts:  symmetric, no nipple changes; no palpable masses or lymphadenopathy on right side.  Left breast - inner upper quadrant there is some ecchymosis and indistinct fullness after biopsy. CV:  Regular rate and rhythm; no murmurs; extremities well-perfused with no edema Abd:  +bowel sounds, soft, non-tender, no palpable organomegaly; no palpable hernias Musc:   Normal gait; no apparent clubbing or cyanosis in extremities Lymphatic:  No palpable cervical or axillary lymphadenopathy Skin:  Warm, dry; no sign of jaundice Psychiatric - alert and oriented x 4; calm mood and affect       Labs, Imaging and Diagnostic Testing: 08/01/22 - LFT's WNL   CLINICAL DATA:  Right upper quadrant pain   EXAM: ULTRASOUND ABDOMEN LIMITED RIGHT UPPER QUADRANT   COMPARISON:  None Available.   FINDINGS: Gallbladder:   Large gallstone. Normal wall thickness. Positive sonographic Murphy.   Common bile duct:   Diameter: 2.6 mm   Liver:   No focal lesion identified. Within normal limits in parenchymal echogenicity. Portal vein is patent on color Doppler imaging with normal direction of blood flow towards the liver.   Other: None.   IMPRESSION: Large gallstone with positive sonographic Murphy. Findings are suspicious for acute cholecystitis in the appropriate clinical setting. No biliary dilatation.     Electronically Signed   By: Kim  Fujinaga M.D.   On: 08/01/2022 23:44     Assessment and Plan:  Diagnoses and all orders for this visit:   Calculus of gallbladder with chronic cholecystitis without obstruction   Fibroadenoma of breast, left       The main issue today is the gallbladder symptoms with two recent ED visits.  For this issue, would recommend laparoscopic cholecystectomy with intraoperative cholangiogram.  The surgical procedure has been discussed with the patient.  Potential risks, benefits, alternative treatments, and expected outcomes have been explained.  All of the patient's questions at this time have been answered.  The likelihood of reaching the patient's treatment goal is good.  The patient understand the proposed surgical procedure and wishes to proceed.   We also discussed her left breast fibroadenoma, along with her family history of breast cancer.  I offered a left breast radioactive seed localized lumpectomy to confirm and  treat the fibroadenoma.  We can do this at the same time as the cholecystectomy.       Sharyah Bostwick K. Zamaya Rapaport, MD, FACS [facility]  General Surgery   08/11/2022 5:36 PM  

## 2022-08-18 NOTE — Pre-Procedure Instructions (Signed)
Surgical Instructions    Your procedure is scheduled on August 27, 2022.  Report to [facility] Main Entrance "A" at 11:30 A.M., then check in with the Admitting office.  Call this number if you have problems the morning of surgery:  [phone]   If you have any questions prior to your surgery date call [phone]: Open Monday-Friday 8am-4pm If you experience any cold or flu symptoms such as cough, fever, chills, shortness of breath, etc. between now and your scheduled surgery, please notify us at the above number     Remember:  Do not eat after midnight the night before your surgery  You may drink clear liquids until 10:30AM the morning of your surgery.   Clear liquids allowed are: Water, Non-Citrus Juices (without pulp), Carbonated Beverages, Clear Tea, Black Coffee ONLY (NO MILK, CREAM OR POWDERED CREAMER of any kind), and Gatorade    Take these medicines the morning of surgery with A SIP OF WATER:   If needed: acetaminophen (TYLENOL)  ondansetron (ZOFRAN-ODT)  oxyCODONE-acetaminophen (PERCOCET/ROXICET)   As of today, STOP taking any Aspirin (unless otherwise instructed by your surgeon) Aleve, Naproxen, Ibuprofen, Motrin, Advil, Goody's, BC's, all herbal medications, fish oil, and all vitamins.            [place] is not responsible for any belongings or valuables.    Do NOT Smoke (Tobacco/Vaping)  24 hours prior to your procedure  If you use a CPAP at night, you may bring your mask for your overnight stay.   Contacts, glasses, hearing aids, dentures or partials may not be worn into surgery, please bring cases for these belongings   For patients admitted to the hospital, discharge time will be determined by your treatment team.   Patients discharged the day of surgery will not be allowed to drive home, and someone needs to stay with them for 24 hours.   SURGICAL WAITING ROOM VISITATION Patients having surgery or a procedure may have no more than 2 support people in  the waiting area - these visitors may rotate.   Children under the age of 61 must have an adult with them who is not the patient. If the patient needs to stay at the hospital during part of their recovery, the visitor guidelines for inpatient rooms apply. Pre-op nurse will coordinate an appropriate time for 1 support person to accompany patient in pre-op.  This support person may not rotate.   Please refer to [email or web address] for the visitor guidelines for Inpatients (after your surgery is over and you are in a regular room).    Special instructions:    Oral Hygiene is also important to reduce your risk of infection.  Remember - BRUSH YOUR TEETH THE MORNING OF SURGERY WITH YOUR REGULAR TOOTHPASTE   Amanda Fox- Preparing For Surgery  Before surgery, you can play an important role. Because skin is not sterile, your skin needs to be as free of germs as possible. You can reduce the number of germs on your skin by washing with CHG (chlorahexidine gluconate) Soap before surgery.  CHG is an antiseptic cleaner which kills germs and bonds with the skin to continue killing germs even after washing.     Please do not use if you have an allergy to CHG or antibacterial soaps. If your skin becomes reddened/irritated stop using the CHG.  Do not shave (including legs and underarms) for at least 48 hours prior to first CHG shower. It is OK to shave your face.  Please follow these  instructions carefully.     Shower the NIGHT BEFORE SURGERY and the MORNING OF SURGERY with CHG Soap.   If you chose to wash your hair, wash your hair first as usual with your normal shampoo. After you shampoo, rinse your hair and body thoroughly to remove the shampoo.  Then [employer] and genitals (private parts) with your normal soap and rinse thoroughly to remove soap.  After that Use CHG Soap as you would any other liquid soap. You can apply CHG directly to the skin and wash  gently with a scrungie or a clean washcloth.   Apply the CHG Soap to your body ONLY FROM THE NECK DOWN.  Do not use on open wounds or open sores. Avoid contact with your eyes, ears, mouth and genitals (private parts). Wash Face and genitals (private parts)  with your normal soap.   Wash thoroughly, paying special attention to the area where your surgery will be performed.  Thoroughly rinse your body with warm water from the neck down.  DO NOT shower/wash with your normal soap after using and rinsing off the CHG Soap.  Pat yourself dry with a CLEAN TOWEL.  Wear CLEAN PAJAMAS to bed the night before surgery  Place CLEAN SHEETS on your bed the night before your surgery  DO NOT SLEEP WITH PETS.   Day of Surgery:  Take a shower with CHG soap. Wear Clean/Comfortable clothing the morning of surgery Do not wear jewelry or makeup. Do not wear lotions, powders, perfumes/cologne or deodorant. Do not shave 48 hours prior to surgery.  Men may shave face and neck. Do not bring valuables to the hospital. Do not wear nail polish, gel polish, artificial nails, or any other type of covering on natural nails (fingers and toes) If you have artificial nails or gel coating that need to be removed by a nail salon, please have this removed prior to surgery. Artificial nails or gel coating may interfere with anesthesia's ability to adequately monitor your vital signs. Remember to brush your teeth WITH YOUR REGULAR TOOTHPASTE.    If you received a COVID test during your pre-op visit, it is requested that you wear a mask when out in public, stay away from anyone that may not be feeling well, and notify your surgeon if you develop symptoms. If you have been in contact with anyone that has tested positive in the last 10 days, please notify your surgeon.    Please read over the following fact sheets that you were given.

## 2022-08-19 ENCOUNTER — Encounter (HOSPITAL_COMMUNITY): Payer: Self-pay

## 2022-08-19 ENCOUNTER — Other Ambulatory Visit: Payer: Self-pay

## 2022-08-19 ENCOUNTER — Encounter (HOSPITAL_COMMUNITY)
Admission: RE | Admit: 2022-08-19 | Discharge: 2022-08-19 | Disposition: A | Discharge status: Home / Self Care | Payer: BC Managed Care – PPO | Source: Ambulatory Visit | Attending: Surgery | Admitting: Surgery

## 2022-08-19 DIAGNOSIS — Z01812 Encounter for preprocedural laboratory examination: Secondary | ICD-10-CM | POA: Insufficient documentation

## 2022-08-19 HISTORY — DX: Cholecystitis, unspecified: K81.9

## 2022-08-19 NOTE — Progress Notes (Signed)
PCP - Ladora Daniel Cardiologist - Denies  PPM/ICD - Denies Device Orders -  Rep Notified -   Chest x-ray - N/I EKG - 06/05/22 Stress Test - No ECHO -No Cardiac Cath - No  Sleep Study - No OSA  DM - Denies  COVID TEST- n/a   Anesthesia review: Yes seed to be placed  Patient denies shortness of breath, fever, cough and chest pain at PAT appointment   All instructions explained to the patient, with a verbal understanding of the material. Patient agrees to go over the instructions while at home for a better understanding. The opportunity to ask questions was provided.

## 2022-08-26 ENCOUNTER — Ambulatory Visit
Admission: RE | Admit: 2022-08-26 | Discharge: 2022-08-26 | Disposition: A | Discharge status: Home / Self Care | Payer: BC Managed Care – PPO | Source: Ambulatory Visit | Attending: Surgery | Admitting: Surgery

## 2022-08-26 DIAGNOSIS — D242 Benign neoplasm of left breast: Secondary | ICD-10-CM

## 2022-08-26 HISTORY — PX: BREAST BIOPSY: SHX20

## 2022-08-27 ENCOUNTER — Encounter (HOSPITAL_COMMUNITY): Payer: Self-pay | Admitting: Surgery

## 2022-08-27 ENCOUNTER — Ambulatory Visit (HOSPITAL_COMMUNITY)
Admission: RE | Admit: 2022-08-27 | Discharge: 2022-08-27 | Disposition: A | Discharge status: Home / Self Care | Payer: BC Managed Care – PPO | Attending: Surgery | Admitting: Surgery

## 2022-08-27 ENCOUNTER — Ambulatory Visit (HOSPITAL_COMMUNITY): Payer: BC Managed Care – PPO | Admitting: Physician Assistant

## 2022-08-27 ENCOUNTER — Ambulatory Visit
Admission: RE | Admit: 2022-08-27 | Discharge: 2022-08-27 | Disposition: A | Discharge status: Home / Self Care | Payer: BC Managed Care – PPO | Source: Ambulatory Visit | Attending: Surgery | Admitting: Surgery

## 2022-08-27 ENCOUNTER — Ambulatory Visit (HOSPITAL_COMMUNITY): Payer: BC Managed Care – PPO

## 2022-08-27 ENCOUNTER — Other Ambulatory Visit: Payer: Self-pay

## 2022-08-27 ENCOUNTER — Ambulatory Visit (HOSPITAL_COMMUNITY): Payer: BC Managed Care – PPO | Admitting: Anesthesiology

## 2022-08-27 ENCOUNTER — Encounter (HOSPITAL_COMMUNITY)
Admission: RE | Disposition: A | Discharge status: Home / Self Care | Payer: Self-pay | Source: Home / Self Care | Attending: Surgery

## 2022-08-27 DIAGNOSIS — K219 Gastro-esophageal reflux disease without esophagitis: Secondary | ICD-10-CM | POA: Insufficient documentation

## 2022-08-27 DIAGNOSIS — N6022 Fibroadenosis of left breast: Secondary | ICD-10-CM | POA: Insufficient documentation

## 2022-08-27 DIAGNOSIS — Z803 Family history of malignant neoplasm of breast: Secondary | ICD-10-CM | POA: Insufficient documentation

## 2022-08-27 DIAGNOSIS — N6489 Other specified disorders of breast: Secondary | ICD-10-CM | POA: Diagnosis not present

## 2022-08-27 DIAGNOSIS — D242 Benign neoplasm of left breast: Secondary | ICD-10-CM

## 2022-08-27 DIAGNOSIS — K8010 Calculus of gallbladder with chronic cholecystitis without obstruction: Secondary | ICD-10-CM | POA: Insufficient documentation

## 2022-08-27 DIAGNOSIS — Z79890 Hormone replacement therapy (postmenopausal): Secondary | ICD-10-CM | POA: Diagnosis not present

## 2022-08-27 HISTORY — PX: CHOLECYSTECTOMY: SHX55

## 2022-08-27 HISTORY — PX: BREAST LUMPECTOMY WITH RADIOACTIVE SEED LOCALIZATION: SHX6424

## 2022-08-27 SURGERY — BREAST LUMPECTOMY WITH RADIOACTIVE SEED LOCALIZATION
Anesthesia: General | Site: Breast

## 2022-08-27 MED ORDER — ONDANSETRON HCL 4 MG/2ML IJ SOLN
INTRAMUSCULAR | Status: DC | PRN
  Administered 2022-08-27: 4 mg via INTRAVENOUS

## 2022-08-27 MED ORDER — CHLORHEXIDINE GLUCONATE CLOTH 2 % EX PADS: 6.0000 | MEDICATED_PAD | Freq: Once | CUTANEOUS | Status: DC

## 2022-08-27 MED ORDER — ONDANSETRON HCL 4 MG/2ML IJ SOLN: 4.0000 mg | Freq: Once | INTRAMUSCULAR | Status: DC | PRN

## 2022-08-27 MED ORDER — CHLORHEXIDINE GLUCONATE 0.12 % MT SOLN
15.0000 mL | OROMUCOSAL | Status: AC
  Administered 2022-08-27: 15 mL via OROMUCOSAL
  Filled 2022-08-27: qty 15

## 2022-08-27 MED ORDER — LIDOCAINE 2% (20 MG/ML) 5 ML SYRINGE
INTRAMUSCULAR | Status: DC | PRN
  Administered 2022-08-27: 60 mg via INTRAVENOUS

## 2022-08-27 MED ORDER — PROPOFOL 10 MG/ML IV BOLUS
INTRAVENOUS | Status: DC | PRN
  Administered 2022-08-27: 170 mg via INTRAVENOUS
  Administered 2022-08-27: 20 mg via INTRAVENOUS

## 2022-08-27 MED ORDER — ACETAMINOPHEN 160 MG/5ML PO SOLN: 325.0000 mg | ORAL | Status: DC | PRN

## 2022-08-27 MED ORDER — ACETAMINOPHEN 500 MG PO TABS
1000.0000 mg | ORAL_TABLET | ORAL | Status: AC
  Administered 2022-08-27: 1000 mg via ORAL
  Filled 2022-08-27: qty 2

## 2022-08-27 MED ORDER — MIDAZOLAM HCL 2 MG/2ML IJ SOLN
INTRAMUSCULAR | Status: AC
  Filled 2022-08-27: qty 2

## 2022-08-27 MED ORDER — OXYCODONE HCL 5 MG PO TABS
5.0000 mg | ORAL_TABLET | Freq: Once | ORAL | Status: AC | PRN
  Administered 2022-08-27: 5 mg via ORAL

## 2022-08-27 MED ORDER — LACTATED RINGERS IV SOLN: INTRAVENOUS | Status: DC

## 2022-08-27 MED ORDER — ACETAMINOPHEN 325 MG PO TABS: 325.0000 mg | ORAL_TABLET | ORAL | Status: DC | PRN

## 2022-08-27 MED ORDER — PROPOFOL 10 MG/ML IV BOLUS
INTRAVENOUS | Status: AC
  Filled 2022-08-27: qty 20

## 2022-08-27 MED ORDER — CEFAZOLIN SODIUM-DEXTROSE 2-4 GM/100ML-% IV SOLN
2.0000 g | INTRAVENOUS | Status: AC
  Administered 2022-08-27: 2 g via INTRAVENOUS
  Filled 2022-08-27: qty 100

## 2022-08-27 MED ORDER — EPHEDRINE SULFATE-NACL 50-0.9 MG/10ML-% IV SOSY
PREFILLED_SYRINGE | INTRAVENOUS | Status: DC | PRN
  Administered 2022-08-27: 7.5 mg via INTRAVENOUS
  Administered 2022-08-27: 5 mg via INTRAVENOUS

## 2022-08-27 MED ORDER — OXYCODONE HCL 5 MG PO TABS
ORAL_TABLET | ORAL | Status: AC
  Filled 2022-08-27: qty 1

## 2022-08-27 MED ORDER — FENTANYL CITRATE (PF) 100 MCG/2ML IJ SOLN
25.0000 ug | INTRAMUSCULAR | Status: DC | PRN
  Administered 2022-08-27: 25 ug via INTRAVENOUS

## 2022-08-27 MED ORDER — ROCURONIUM BROMIDE 10 MG/ML (PF) SYRINGE
PREFILLED_SYRINGE | INTRAVENOUS | Status: DC | PRN
  Administered 2022-08-27: 30 mg via INTRAVENOUS
  Administered 2022-08-27: 60 mg via INTRAVENOUS

## 2022-08-27 MED ORDER — BUPIVACAINE-EPINEPHRINE 0.25% -1:200000 IJ SOLN
INTRAMUSCULAR | Status: DC | PRN
  Administered 2022-08-27: 10 mL
  Administered 2022-08-27: 20 mL

## 2022-08-27 MED ORDER — FENTANYL CITRATE (PF) 100 MCG/2ML IJ SOLN
INTRAMUSCULAR | Status: AC
  Filled 2022-08-27: qty 2

## 2022-08-27 MED ORDER — FENTANYL CITRATE (PF) 250 MCG/5ML IJ SOLN
INTRAMUSCULAR | Status: DC | PRN
  Administered 2022-08-27: 100 ug via INTRAVENOUS
  Administered 2022-08-27: 50 ug via INTRAVENOUS

## 2022-08-27 MED ORDER — ONDANSETRON HCL 4 MG/2ML IJ SOLN
INTRAMUSCULAR | Status: AC
  Filled 2022-08-27: qty 2

## 2022-08-27 MED ORDER — HEMOSTATIC AGENTS (NO CHARGE) OPTIME
TOPICAL | Status: DC | PRN
  Administered 2022-08-27: 1 via TOPICAL

## 2022-08-27 MED ORDER — MEPERIDINE HCL 25 MG/ML IJ SOLN: 6.2500 mg | INTRAMUSCULAR | Status: DC | PRN

## 2022-08-27 MED ORDER — SODIUM CHLORIDE 0.9 % IV SOLN
INTRAVENOUS | Status: DC | PRN
  Administered 2022-08-27: 10 mL

## 2022-08-27 MED ORDER — DEXAMETHASONE SODIUM PHOSPHATE 10 MG/ML IJ SOLN
INTRAMUSCULAR | Status: DC | PRN
  Administered 2022-08-27: 5 mg via INTRAVENOUS

## 2022-08-27 MED ORDER — KETOROLAC TROMETHAMINE 30 MG/ML IJ SOLN
INTRAMUSCULAR | Status: AC
  Filled 2022-08-27: qty 1

## 2022-08-27 MED ORDER — MIDAZOLAM HCL 2 MG/2ML IJ SOLN
INTRAMUSCULAR | Status: DC | PRN
  Administered 2022-08-27: 2 mg via INTRAVENOUS

## 2022-08-27 MED ORDER — FENTANYL CITRATE (PF) 250 MCG/5ML IJ SOLN
INTRAMUSCULAR | Status: AC
  Filled 2022-08-27: qty 5

## 2022-08-27 MED ORDER — LIDOCAINE 2% (20 MG/ML) 5 ML SYRINGE
INTRAMUSCULAR | Status: AC
  Filled 2022-08-27: qty 5

## 2022-08-27 MED ORDER — OXYCODONE HCL 5 MG PO TABS
5.0000 mg | ORAL_TABLET | Freq: Four times a day (QID) | ORAL | 0 refills | Status: AC | PRN
Start: 2022-08-27 — End: ?

## 2022-08-27 MED ORDER — KETOROLAC TROMETHAMINE 15 MG/ML IJ SOLN
INTRAMUSCULAR | Status: DC | PRN
  Administered 2022-08-27: 15 mg via INTRAVENOUS

## 2022-08-27 MED ORDER — SODIUM CHLORIDE 0.9 % IR SOLN
Status: DC | PRN
  Administered 2022-08-27: 1000 mL

## 2022-08-27 MED ORDER — PHENYLEPHRINE 80 MCG/ML (10ML) SYRINGE FOR IV PUSH (FOR BLOOD PRESSURE SUPPORT)
PREFILLED_SYRINGE | INTRAVENOUS | Status: DC | PRN
  Administered 2022-08-27 (×4): 160 ug via INTRAVENOUS

## 2022-08-27 MED ORDER — 0.9 % SODIUM CHLORIDE (POUR BTL) OPTIME
TOPICAL | Status: DC | PRN
  Administered 2022-08-27: 1000 mL

## 2022-08-27 MED ORDER — DEXAMETHASONE SODIUM PHOSPHATE 10 MG/ML IJ SOLN
INTRAMUSCULAR | Status: AC
  Filled 2022-08-27: qty 1

## 2022-08-27 MED ORDER — ROCURONIUM BROMIDE 10 MG/ML (PF) SYRINGE
PREFILLED_SYRINGE | INTRAVENOUS | Status: AC
  Filled 2022-08-27: qty 10

## 2022-08-27 MED ORDER — BUPIVACAINE-EPINEPHRINE (PF) 0.25% -1:200000 IJ SOLN
INTRAMUSCULAR | Status: AC
  Filled 2022-08-27: qty 30

## 2022-08-27 MED ORDER — OXYCODONE HCL 5 MG/5ML PO SOLN: 5.0000 mg | Freq: Once | ORAL | Status: AC | PRN

## 2022-08-27 MED ORDER — SUGAMMADEX SODIUM 200 MG/2ML IV SOLN
INTRAVENOUS | Status: DC | PRN
  Administered 2022-08-27: 25 mg via INTRAVENOUS
  Administered 2022-08-27: 125 mg via INTRAVENOUS

## 2022-08-27 SURGICAL SUPPLY — 72 items
APL PRP STRL LF DISP 70% ISPRP (MISCELLANEOUS) ×2
APL SKNCLS STERI-STRIP NONHPOA (GAUZE/BANDAGES/DRESSINGS) ×4
APPLIER CLIP 9.375 MED OPEN (MISCELLANEOUS)
APPLIER CLIP ROT 10 11.4 M/L (STAPLE) ×2
APR CLP MED 9.3 20 MLT OPN (MISCELLANEOUS)
APR CLP MED LRG 11.4X10 (STAPLE) ×2
BAG COUNTER SPONGE SURGICOUNT (BAG) ×2 IMPLANT
BAG SPEC RTRVL 10 TROC 200 (ENDOMECHANICALS)
BAG SPNG CNTER NS LX DISP (BAG) ×2
BENZOIN TINCTURE PRP APPL 2/3 (GAUZE/BANDAGES/DRESSINGS) ×2 IMPLANT
BINDER BREAST LRG (GAUZE/BANDAGES/DRESSINGS) IMPLANT
BINDER BREAST XLRG (GAUZE/BANDAGES/DRESSINGS) IMPLANT
BINDER BREAST XXLRG (GAUZE/BANDAGES/DRESSINGS) IMPLANT
BLADE CLIPPER SURG (BLADE) IMPLANT
CANISTER SUCT 3000ML PPV (MISCELLANEOUS) ×2 IMPLANT
CHLORAPREP W/TINT 26 (MISCELLANEOUS) ×2 IMPLANT
CLIP APPLIE 9.375 MED OPEN (MISCELLANEOUS) IMPLANT
CLIP APPLIE ROT 10 11.4 M/L (STAPLE) ×2 IMPLANT
COVER MAYO STAND STRL (DRAPES) ×2 IMPLANT
COVER PROBE W GEL 5X96 (DRAPES) ×2 IMPLANT
COVER SURGICAL LIGHT HANDLE (MISCELLANEOUS) ×2 IMPLANT
DEVICE DUBIN SPECIMEN MAMMOGRA (MISCELLANEOUS) ×2 IMPLANT
DRAPE C-ARM 42X120 X-RAY (DRAPES) ×2 IMPLANT
DRAPE CHEST BREAST 15X10 FENES (DRAPES) ×2 IMPLANT
DRSG TEGADERM 2-3/8X2-3/4 SM (GAUZE/BANDAGES/DRESSINGS) ×6 IMPLANT
DRSG TEGADERM 4X4.5 CHG (GAUZE/BANDAGES/DRESSINGS) IMPLANT
DRSG TEGADERM 4X4.75 (GAUZE/BANDAGES/DRESSINGS) ×2 IMPLANT
ELECT CAUTERY BLADE 6.4 (BLADE) ×2 IMPLANT
ELECT REM PT RETURN 9FT ADLT (ELECTROSURGICAL) ×2
ELECTRODE REM PT RTRN 9FT ADLT (ELECTROSURGICAL) ×2 IMPLANT
GAUZE SPONGE 2X2 8PLY STRL LF (GAUZE/BANDAGES/DRESSINGS) ×2 IMPLANT
GLOVE BIO SURGEON STRL SZ7 (GLOVE) ×2 IMPLANT
GLOVE BIOGEL PI IND STRL 7.5 (GLOVE) ×2 IMPLANT
GOWN STRL REUS W/ TWL LRG LVL3 (GOWN DISPOSABLE) ×6 IMPLANT
GOWN STRL REUS W/TWL LRG LVL3 (GOWN DISPOSABLE) ×6
HEMOSTAT SNOW SURGICEL 2X4 (HEMOSTASIS) IMPLANT
ILLUMINATOR WAVEGUIDE N/F (MISCELLANEOUS) IMPLANT
IRRIG SUCT STRYKERFLOW 2 WTIP (MISCELLANEOUS) ×2
IRRIGATION SUCT STRKRFLW 2 WTP (MISCELLANEOUS) ×2 IMPLANT
KIT BASIN OR (CUSTOM PROCEDURE TRAY) ×2 IMPLANT
KIT IMAGING PINPOINTPAQ (MISCELLANEOUS) IMPLANT
KIT MARKER MARGIN INK (KITS) ×2 IMPLANT
KIT TURNOVER KIT B (KITS) ×2 IMPLANT
LIGHT WAVEGUIDE WIDE FLAT (MISCELLANEOUS) IMPLANT
NDL HYPO 25GX1X1/2 BEV (NEEDLE) ×2 IMPLANT
NEEDLE HYPO 25GX1X1/2 BEV (NEEDLE) ×2 IMPLANT
NS IRRIG 1000ML POUR BTL (IV SOLUTION) ×2 IMPLANT
PACK GENERAL/GYN (CUSTOM PROCEDURE TRAY) ×2 IMPLANT
PAD ARMBOARD 7.5X6 YLW CONV (MISCELLANEOUS) ×2 IMPLANT
POUCH RETRIEVAL ECOSAC 10 (ENDOMECHANICALS) IMPLANT
SCISSORS LAP 5X35 DISP (ENDOMECHANICALS) ×2 IMPLANT
SET CHOLANGIOGRAPH 5 50 .035 (SET/KITS/TRAYS/PACK) ×2 IMPLANT
SET TUBE SMOKE EVAC HIGH FLOW (TUBING) ×2 IMPLANT
SLEEVE Z-THREAD 5X100MM (TROCAR) ×2 IMPLANT
SPECIMEN JAR SMALL (MISCELLANEOUS) ×2 IMPLANT
SPONGE T-LAP 4X18 ~~LOC~~+RFID (SPONGE) ×2 IMPLANT
STRIP CLOSURE SKIN 1/2X4 (GAUZE/BANDAGES/DRESSINGS) ×2 IMPLANT
SUT MNCRL AB 4-0 PS2 18 (SUTURE) ×2 IMPLANT
SUT SILK 2 0 SH (SUTURE) IMPLANT
SUT VIC AB 3-0 SH 27 (SUTURE) ×2
SUT VIC AB 3-0 SH 27X BRD (SUTURE) ×2 IMPLANT
SYR CONTROL 10ML LL (SYRINGE) ×2 IMPLANT
SYS BAG RETRIEVAL 10MM (BASKET)
SYSTEM BAG RETRIEVAL 10MM (BASKET) IMPLANT
TOWEL GREEN STERILE (TOWEL DISPOSABLE) ×2 IMPLANT
TOWEL GREEN STERILE FF (TOWEL DISPOSABLE) ×2 IMPLANT
TRAY LAPAROSCOPIC MC (CUSTOM PROCEDURE TRAY) ×2 IMPLANT
TROCAR 11X100 Z THREAD (TROCAR) ×2 IMPLANT
TROCAR BALLN 12MMX100 BLUNT (TROCAR) ×2 IMPLANT
TROCAR Z-THREAD OPTICAL 5X100M (TROCAR) ×2 IMPLANT
WARMER LAPAROSCOPE (MISCELLANEOUS) ×2 IMPLANT
WATER STERILE IRR 1000ML POUR (IV SOLUTION) ×2 IMPLANT

## 2022-08-27 NOTE — Anesthesia Preprocedure Evaluation (Addendum)
Anesthesia Evaluation  Patient identified by MRN, date of birth, ID band Patient awake    Reviewed: Allergy & Precautions, H&P , NPO status , Patient's Chart, lab work & pertinent test results  Airway Mallampati: I  TM Distance: >3 FB Neck ROM: Full    Dental  (+) Teeth Intact, Dental Advisory Given   Pulmonary neg pulmonary ROS   Pulmonary exam normal breath sounds clear to auscultation       Cardiovascular negative cardio ROS Normal cardiovascular exam Rhythm:Regular Rate:Normal     Neuro/Psych  PSYCHIATRIC DISORDERS      negative neurological ROS     GI/Hepatic negative GI ROS, Neg liver ROS,GERD  Medicated,,  Endo/Other  Primary Hyperparathyroidism  Renal/GU negative Renal ROS  negative genitourinary   Musculoskeletal negative musculoskeletal ROS (+)    Abdominal   Peds  Hematology negative hematology ROS (+) Blood dyscrasia, anemia   Anesthesia Other Findings   Reproductive/Obstetrics negative OB ROS                             Anesthesia Physical Anesthesia Plan  ASA: 3  Anesthesia Plan: General   Post-op Pain Management: Toradol IV (intra-op)* and Ofirmev IV (intra-op)*   Induction: Intravenous  PONV Risk Score and Plan: 3 and Ondansetron, Dexamethasone and Treatment may vary due to age or medical condition  Airway Management Planned: Oral ETT  Additional Equipment: None  Intra-op Plan:   Post-operative Plan: Extubation in OR  Informed Consent: I have reviewed the patients History and Physical, chart, labs and discussed the procedure including the risks, benefits and alternatives for the proposed anesthesia with the patient or authorized representative who has indicated his/her understanding and acceptance.     Dental advisory given  Plan Discussed with: CRNA and Anesthesiologist  Anesthesia Plan Comments:         Anesthesia Quick Evaluation

## 2022-08-27 NOTE — Transfer of Care (Signed)
Immediate Anesthesia Transfer of Care Note  Patient: Amanda Fox  Procedure(s) Performed: LEFT BREAST LUMPECTOMY WITH RADIOACTIVE SEED LOCALIZATION (Left: Breast) LAPAROSCOPIC CHOLECYSTECTOMY WITH INTRAOPERATIVE CHOLANGIOGRAM (Abdomen)  Patient Location: PACU  Anesthesia Type:General  Level of Consciousness: awake, alert , and oriented  Airway & Oxygen Therapy: Patient Spontanous Breathing  Post-op Assessment: Report given to RN, Post -op Vital signs reviewed and stable, and Patient moving all extremities X 4  Post vital signs: Reviewed and stable  Last Vitals:  Vitals Value Taken Time  BP 119/80 08/27/22 1530  Temp    Pulse 78 08/27/22 1533  Resp 22 08/27/22 1533  SpO2 99 % 08/27/22 1533  Vitals shown include unvalidated device data.  Last Pain:  Vitals:   08/27/22 1152  PainSc: 0-No pain      Patients Stated Pain Goal: 0 (08/27/22 1152)  Complications: No notable events documented.

## 2022-08-27 NOTE — Discharge Instructions (Signed)
[facility] LAPAROSCOPIC SURGERY: POST OP INSTRUCTIONS Always review your discharge instruction sheet given to you by the facility where your surgery was performed. IF YOU HAVE DISABILITY OR FAMILY LEAVE FORMS, YOU MUST BRING THEM TO THE OFFICE FOR PROCESSING.   DO NOT GIVE THEM TO YOUR DOCTOR.  A prescription for pain medication will be given to you upon discharge.  Take your pain medication as prescribed, if needed.  If narcotic pain medicine is not needed, then you may take acetaminophen (Tylenol) or ibuprofen (Advil) as needed. Take your usually prescribed medications unless otherwise directed. If you need a refill on your pain medication, please contact your pharmacy.  They will contact our office to request authorization. Prescriptions will not be filled after 5pm or on week-ends. You should follow a light diet the first few days after arrival home, such as soup and crackers, etc.  Be sure to include lots of fluids daily. Most patients will experience some swelling and bruising in the area of the incisions.  Ice packs will help.  Swelling and bruising can take several days to resolve.  It is common to experience some constipation if taking pain medication after surgery.  Increasing fluid intake and taking a stool softener (such as Colace) will usually help or prevent this problem from occurring.  A mild laxative (Milk of Magnesia or Miralax) should be taken according to package instructions if there are no bowel movements after 48 hours. Unless discharge instructions indicate otherwise, you may remove your bandages 48 hours after surgery, and you may shower at that time.  You will have steri-strips (small skin tapes) in place directly over the incision.  These strips should be left on the skin for 7-10 days.  ACTIVITIES:  You may resume regular (light) daily activities beginning the next day--such as daily self-care, walking, climbing stairs--gradually increasing activities  as tolerated.  You may have sexual intercourse when it is comfortable.  Refrain from any heavy lifting or straining until approved by your doctor. You may drive when you are no longer taking prescription pain medication, you can comfortably wear a seatbelt, and you can safely maneuver your car and apply brakes. RETURN TO WORK:   2-3 weeks You should see your doctor in the office for a follow-up appointment approximately 2-3 weeks after your surgery.  Make sure that you call for this appointment within a day or two after you arrive home to insure a convenient appointment time. OTHER INSTRUCTIONS: ________________________________________________________________________ WHEN TO CALL YOUR DOCTOR: Fever over 101.0 Inability to urinate Continued bleeding from incision. Increased pain, redness, or drainage from the incision. Increasing abdominal pain  The clinic staff is available to answer your questions during regular business hours.  Please don't hesitate to call and ask to speak to one of the nurses for clinical concerns.  If you have a medical emergency, go to the nearest emergency room or call 911.  A surgeon from [facility] is always on call at the hospital. [address], [address], [place], [place]  [address] ? [address] [address], [place], [place]   [address] [phone] ? [phone] ? FAX [phone] Web site: [email or web address]  

## 2022-08-27 NOTE — Anesthesia Procedure Notes (Addendum)
Procedure Name: Intubation Date/Time: 08/27/2022 1:00 PM  Performed by: Aundria Rud, CRNAPre-anesthesia Checklist: Patient identified, Emergency Drugs available, Suction available and Patient being monitored Patient Re-evaluated:Patient Re-evaluated prior to induction Oxygen Delivery Method: Circle System Utilized Preoxygenation: Pre-oxygenation with 100% oxygen Induction Type: IV induction Ventilation: Mask ventilation without difficulty Laryngoscope Size: Mac and 4 Grade View: Grade I Tube type: Oral Tube size: 7.0 mm Number of attempts: 1 Airway Equipment and Method: Stylet and Oral airway Placement Confirmation: ETT inserted through vocal cords under direct vision, positive ETCO2 and breath sounds checked- equal and bilateral Secured at: 22 cm Tube secured with: Tape Dental Injury: Teeth and Oropharynx as per pre-operative assessment

## 2022-08-27 NOTE — Interval H&P Note (Signed)
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  The various methods of treatment have been discussed with the patient and family. After consideration of risks, benefits and other options for treatment, the patient has consented to  Procedure(s): LEFT BREAST LUMPECTOMY WITH RADIOACTIVE SEED LOCALIZATION (Left) LAPAROSCOPIC CHOLECYSTECTOMY WITH INTRAOPERATIVE CHOLANGIOGRAM (N/A) as a surgical intervention.  The patient's history has been reviewed, patient examined, no change in status, stable for surgery.  I have reviewed the patient's chart and labs.  Questions were answered to the patient's satisfaction.     Wynona Luna

## 2022-08-27 NOTE — Op Note (Signed)
Pre-op Diagnosis:  Left breast fibroadenoma/ chronic calculus cholecystitis Post-op Diagnosis: same Procedure:  Left breast radioactive seed localized lumpectomy/ laparoscopic cholecystectomy with intraoperative cholangiogram Surgeon:  Karma Hiney K. Resident:  Dr. Margarito Courser, PGY-3 I was personally present during the key and critical portions of this procedure and immediately available throughout the entire procedure, as documented in my operative note. Anesthesia:  GEN - LMA Indications: This is a 52 year old female in good health who presents after two recent episodes of severe pain in the epigastrium.  The first was in March and she felt that she was having a heart attack. Work-up in the ED revealed no cardiac etiology.  She has had intermittent symptoms of nausea and vomiting, as well as some bloating.  Her symptoms seem to be exacerbated by eating greasy foods.  Last week, she ate a burrito and woke up hours later with severe abdominal pain in the epigastrium and RUQ.  She presented to the ED for evaluation.  She was found to have cholelithiasis without acute cholecystitis.  Lipase was mildly elevated but LFT's WNL.  She has been on a strict non-fat diet for the last few days and has had no symptoms.   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  This mass measures 1.2 x 0.7 x 1.3 cm.  She has a family history of breast cancer in her mother and is eager to have this area excised as well. She is currently on hormone replacement therapy.  Description of procedure: The patient is brought to the operating room placed in supine position on the operating room table. After an adequate level of general anesthesia was obtained, her left breast was prepped with ChloraPrep and draped in sterile fashion. A timeout was taken to ensure the proper patient and proper procedure. We interrogated the  breast with the neoprobe. We made a transverse incision near the mass in the upper inner quadrant after infiltrating with 0.25% Marcaine. Dissection was carried down in the breast tissue with cautery. We used the neoprobe to guide [place] towards the radioactive seed. We excised an area of tissue around the radioactive seed 2 cm in diameter. The specimen was removed and was oriented with a paint kit. Specimen mammogram showed the radioactive seed as well as the biopsy clip within the specimen. This was sent for pathologic examination. There is no residual radioactivity within the biopsy cavity. We inspected carefully for hemostasis. The wound was thoroughly irrigated. The wound was closed with a deep layer of 3-0 Vicryl and a subcuticular layer of 4-0 Monocryl. Benzoin Steri-Strips were applied.  We then turned our attention to the abdomen.    The abdomen was prepped and draped in sterile fashion.  A timeout was taken again.  Local anesthetic agent was injected into the skin near the umbilicus and an incision made. We dissected down to the abdominal fascia with blunt dissection.  The fascia was incised vertically and we entered the peritoneal cavity bluntly.  A pursestring suture of 0-Vicryl was placed around the fascial opening.  The Hasson cannula was inserted and secured with the stay suture.  Pneumoperitoneum was then created with CO2 and tolerated well without any adverse changes in the patient's vital signs. An 11-mm port was placed in the subxiphoid position.  Two 5-mm ports were placed in the right upper quadrant. All skin incisions were infiltrated with a local anesthetic agent before making  the incision and placing the trocars.   We positioned the patient in reverse Trendelenburg, tilted slightly to the patient's left.  The gallbladder was identified, the fundus grasped and retracted cephalad.  There was some chronic scarring and omental adhesions to the gallbladder.  Adhesions were lysed bluntly and  with the electrocautery where indicated, taking care not to injure any adjacent organs or viscus. The infundibulum was grasped and retracted laterally, exposing the peritoneum overlying the triangle of Calot. This was then divided and exposed in a blunt fashion. A critical view of the cystic duct and cystic artery was obtained.  The cystic duct was clearly identified and bluntly dissected circumferentially. The cystic duct was ligated with a clip distally.   An incision was made in the cystic duct and the [facility] cholangiogram catheter introduced. The catheter was secured using a clip. A cholangiogram was then obtained which showed good visualization of the distal and proximal biliary tree with no sign of filling defects or obstruction.  Contrast flowed easily into the duodenum. The catheter was then removed.   The cystic duct was then ligated with clips and divided. The cystic artery was identified, dissected free, ligated with clips and divided as well.   The gallbladder was dissected from the liver bed in retrograde fashion with the electrocautery. The gallbladder was removed and placed in a retrieval sac. The liver bed was irrigated and inspected. Hemostasis was achieved with the electrocautery and SNOW. Copious irrigation was utilized and was repeatedly aspirated until clear.  The gallbladder and sac were then removed through the umbilical port site.  The pursestring suture was used to close the umbilical fascia.    We again inspected the right upper quadrant for hemostasis.  Pneumoperitoneum was released as we removed the trocars.  4-0 Monocryl was used to close the skin.   Benzoin, steri-strips, and clean dressings were applied. The patient was then extubated and brought to the recovery room in stable condition. Instrument, sponge, and needle counts were correct at closure and at the conclusion of the case.   Findings: Cholecystitis with Cholelithiasis  Estimated Blood Loss: Minimal         Drains:  none         Specimens: Gallbladder           Complications: None; patient tolerated the procedure well.         Disposition: PACU - hemodynamically stable.         Condition: stable  Wilmon Arms. Corliss Skains, MD, [facility]  General Surgery   08/27/2022 3:26 PM

## 2022-08-28 ENCOUNTER — Encounter (HOSPITAL_COMMUNITY): Payer: Self-pay | Admitting: Surgery

## 2022-08-28 NOTE — Anesthesia Postprocedure Evaluation (Signed)
Anesthesia Post Note  Patient: Amanda Fox  Procedure(s) Performed: LEFT BREAST LUMPECTOMY WITH RADIOACTIVE SEED LOCALIZATION (Left: Breast) LAPAROSCOPIC CHOLECYSTECTOMY WITH INTRAOPERATIVE CHOLANGIOGRAM (Abdomen)     Patient location during evaluation: PACU Anesthesia Type: General Level of consciousness: awake and alert Pain management: pain level controlled Vital Signs Assessment: post-procedure vital signs reviewed and stable Respiratory status: spontaneous breathing, nonlabored ventilation, respiratory function stable and patient connected to nasal cannula oxygen Cardiovascular status: blood pressure returned to baseline and stable Postop Assessment: no apparent nausea or vomiting Anesthetic complications: no   No notable events documented.  Last Vitals:  Vitals:   08/27/22 1615 08/27/22 1630  BP: 96/60 104/66  Pulse: 66 82  Resp: (!) 24 (!) 26  Temp:  37.1 C  SpO2: 92% 98%    Last Pain:  Vitals:   08/27/22 1630  PainSc: 3                  Collene Schlichter

## 2022-09-01 LAB — SURGICAL PATHOLOGY

## 2023-01-15 ENCOUNTER — Other Ambulatory Visit: Payer: BC Managed Care – PPO

## 2023-12-09 ENCOUNTER — Other Ambulatory Visit: Payer: Self-pay | Admitting: Obstetrics and Gynecology

## 2023-12-09 DIAGNOSIS — Z9189 Other specified personal risk factors, not elsewhere classified: Secondary | ICD-10-CM

## 2023-12-22 ENCOUNTER — Encounter: Payer: Self-pay | Admitting: Obstetrics and Gynecology

## 2024-01-08 ENCOUNTER — Ambulatory Visit
Admission: RE | Admit: 2024-01-08 | Discharge: 2024-01-08 | Disposition: A | Discharge status: Home / Self Care | Source: Ambulatory Visit | Attending: Obstetrics and Gynecology | Admitting: Obstetrics and Gynecology

## 2024-01-08 DIAGNOSIS — Z9189 Other specified personal risk factors, not elsewhere classified: Secondary | ICD-10-CM

## 2024-01-08 MED ORDER — GADOPICLENOL 0.5 MMOL/ML IV SOLN
8.0000 mL | Freq: Once | INTRAVENOUS | Status: AC | PRN
  Administered 2024-01-08: 8 mL via INTRAVENOUS

## 2024-01-14 ENCOUNTER — Other Ambulatory Visit: Payer: Self-pay | Admitting: Obstetrics and Gynecology

## 2024-01-14 DIAGNOSIS — R928 Other abnormal and inconclusive findings on diagnostic imaging of breast: Secondary | ICD-10-CM

## 2024-01-20 ENCOUNTER — Other Ambulatory Visit: Payer: Self-pay | Admitting: Obstetrics and Gynecology

## 2024-01-20 DIAGNOSIS — R928 Other abnormal and inconclusive findings on diagnostic imaging of breast: Secondary | ICD-10-CM

## 2024-02-01 ENCOUNTER — Ambulatory Visit
Admission: RE | Admit: 2024-02-01 | Discharge: 2024-02-01 | Disposition: A | Discharge status: Home / Self Care | Source: Ambulatory Visit | Attending: Obstetrics and Gynecology | Admitting: Obstetrics and Gynecology

## 2024-02-01 ENCOUNTER — Other Ambulatory Visit: Payer: Self-pay | Admitting: Obstetrics and Gynecology

## 2024-02-01 ENCOUNTER — Ambulatory Visit
Admission: RE | Admit: 2024-02-01 | Discharge: 2024-02-01 | Disposition: A | Source: Ambulatory Visit | Attending: Obstetrics and Gynecology | Admitting: Obstetrics and Gynecology

## 2024-02-01 DIAGNOSIS — R928 Other abnormal and inconclusive findings on diagnostic imaging of breast: Secondary | ICD-10-CM

## 2024-02-01 MED ORDER — GADOPICLENOL 0.5 MMOL/ML IV SOLN
7.0000 mL | Freq: Once | INTRAVENOUS | Status: AC | PRN
  Administered 2024-02-01: 7 mL via INTRAVENOUS

## 2024-02-02 LAB — SURGICAL PATHOLOGY
